# Patient Record
Sex: Female | Born: 1960 | Race: White | Hispanic: No | Marital: Married | Smoking: Never smoker
Health system: Southern US, Community
[De-identification: ages and names within clinical notes are randomized; demographics above are authoritative.]

## PROBLEM LIST (undated history)

## (undated) DIAGNOSIS — F319 Bipolar disorder, unspecified: Secondary | ICD-10-CM

## (undated) DIAGNOSIS — I10 Essential (primary) hypertension: Secondary | ICD-10-CM

## (undated) DIAGNOSIS — F329 Major depressive disorder, single episode, unspecified: Secondary | ICD-10-CM

## (undated) DIAGNOSIS — G629 Polyneuropathy, unspecified: Secondary | ICD-10-CM

## (undated) DIAGNOSIS — F4000 Agoraphobia, unspecified: Secondary | ICD-10-CM

## (undated) DIAGNOSIS — E119 Type 2 diabetes mellitus without complications: Secondary | ICD-10-CM

## (undated) DIAGNOSIS — F4310 Post-traumatic stress disorder, unspecified: Secondary | ICD-10-CM

## (undated) DIAGNOSIS — F419 Anxiety disorder, unspecified: Secondary | ICD-10-CM

## (undated) DIAGNOSIS — F32A Depression, unspecified: Secondary | ICD-10-CM

## (undated) DIAGNOSIS — F410 Panic disorder [episodic paroxysmal anxiety] without agoraphobia: Secondary | ICD-10-CM

## (undated) HISTORY — DX: Essential (primary) hypertension: I10

## (undated) HISTORY — DX: Agoraphobia, unspecified: F40.00

## (undated) HISTORY — DX: Major depressive disorder, single episode, unspecified: F32.9

## (undated) HISTORY — DX: Type 2 diabetes mellitus without complications: E11.9

## (undated) HISTORY — DX: Post-traumatic stress disorder, unspecified: F43.10

## (undated) HISTORY — DX: Depression, unspecified: F32.A

## (undated) HISTORY — PX: ACHILLES TENDON SURGERY: SHX542

## (undated) HISTORY — DX: Bipolar disorder, unspecified: F31.9

## (undated) HISTORY — DX: Anxiety disorder, unspecified: F41.9

## (undated) HISTORY — DX: Polyneuropathy, unspecified: G62.9

## (undated) HISTORY — DX: Panic disorder (episodic paroxysmal anxiety): F41.0

---

## 2000-02-11 ENCOUNTER — Other Ambulatory Visit: Admission: RE | Admit: 2000-02-11 | Discharge: 2000-02-11 | Payer: Self-pay | Admitting: Obstetrics & Gynecology

## 2000-02-14 ENCOUNTER — Emergency Department (HOSPITAL_COMMUNITY): Admission: EM | Admit: 2000-02-14 | Discharge: 2000-02-14 | Payer: Self-pay | Admitting: Emergency Medicine

## 2000-02-20 ENCOUNTER — Ambulatory Visit (HOSPITAL_COMMUNITY): Admission: RE | Admit: 2000-02-20 | Discharge: 2000-02-20 | Payer: Self-pay | Admitting: Obstetrics & Gynecology

## 2000-02-20 ENCOUNTER — Encounter: Payer: Self-pay | Admitting: Obstetrics & Gynecology

## 2000-02-22 ENCOUNTER — Emergency Department (HOSPITAL_COMMUNITY): Admission: EM | Admit: 2000-02-22 | Discharge: 2000-02-22 | Payer: Self-pay | Admitting: Emergency Medicine

## 2000-03-04 ENCOUNTER — Other Ambulatory Visit: Admission: RE | Admit: 2000-03-04 | Discharge: 2000-03-04 | Payer: Self-pay | Admitting: Obstetrics & Gynecology

## 2000-03-04 ENCOUNTER — Encounter (INDEPENDENT_AMBULATORY_CARE_PROVIDER_SITE_OTHER): Payer: Self-pay | Admitting: Specialist

## 2000-07-30 ENCOUNTER — Emergency Department (HOSPITAL_COMMUNITY): Admission: EM | Admit: 2000-07-30 | Discharge: 2000-07-30 | Payer: Self-pay | Admitting: Emergency Medicine

## 2000-07-30 ENCOUNTER — Encounter: Payer: Self-pay | Admitting: Emergency Medicine

## 2000-12-01 ENCOUNTER — Ambulatory Visit (HOSPITAL_BASED_OUTPATIENT_CLINIC_OR_DEPARTMENT_OTHER): Admission: RE | Admit: 2000-12-01 | Discharge: 2000-12-01 | Payer: Self-pay | Admitting: Orthopedic Surgery

## 2001-08-04 ENCOUNTER — Other Ambulatory Visit: Admission: RE | Admit: 2001-08-04 | Discharge: 2001-08-04 | Payer: Self-pay | Admitting: Obstetrics and Gynecology

## 2001-08-09 ENCOUNTER — Encounter: Payer: Self-pay | Admitting: Obstetrics and Gynecology

## 2001-08-09 ENCOUNTER — Encounter: Admission: RE | Admit: 2001-08-09 | Discharge: 2001-08-09 | Payer: Self-pay | Admitting: Obstetrics and Gynecology

## 2001-08-15 ENCOUNTER — Encounter: Payer: Self-pay | Admitting: Obstetrics and Gynecology

## 2001-08-15 ENCOUNTER — Ambulatory Visit (HOSPITAL_COMMUNITY): Admission: RE | Admit: 2001-08-15 | Discharge: 2001-08-15 | Payer: Self-pay | Admitting: Obstetrics and Gynecology

## 2006-06-15 ENCOUNTER — Encounter: Admission: RE | Admit: 2006-06-15 | Discharge: 2006-06-15 | Payer: Self-pay | Admitting: Sports Medicine

## 2006-09-28 ENCOUNTER — Encounter: Admission: RE | Admit: 2006-09-28 | Discharge: 2006-09-28 | Payer: Self-pay | Admitting: Neurosurgery

## 2007-09-07 ENCOUNTER — Encounter: Admission: RE | Admit: 2007-09-07 | Discharge: 2007-09-07 | Payer: Self-pay | Admitting: Internal Medicine

## 2008-03-14 ENCOUNTER — Ambulatory Visit: Payer: Self-pay | Admitting: Obstetrics and Gynecology

## 2008-03-14 ENCOUNTER — Encounter: Payer: Self-pay | Admitting: Obstetrics & Gynecology

## 2010-12-09 NOTE — Group Therapy Note (Signed)
NAMEJESSAMY, [name] NO.:  [id number]   MEDICAL RECORD NO.:  [id number]          PATIENT TYPE:  WOC   LOCATION:  [facility]                   FACILITY:  [facility]   PHYSICIAN:  Argentina Donovan, MD        DATE OF BIRTH:  Feb 04, 1961   DATE OF SERVICE:                                  CLINIC NOTE   CHIEF COMPLAINT:  Annual exam.   HISTORY OF PRESENT ILLNESS:  [name] is a 50 year old female with a  past medical history of diabetes and mood disorder who is followed by  primary care physician for her other chronic medical issues.  He also  handles her annual referrals for mammogram.  She is here today for  annual checkup including Pap smear.  Last Pap smear was in approximately  2004, although, the patient is not sure.  She says she has never had an  abnormal Pap smear.  The last mammogram was May 2009 and it was normal.   PAST MEDICAL HISTORY:  1. Hypertension.  2. Diabetes mellitus.  3. Mood disorder.   ALLERGIES:  No known drug allergies.   MEDICATIONS:  1. Lisinopril 10 mg p.o. daily.  2. Cymbalta 60 mg p.o. daily.  3. Xanax 0.25 mg p.o. p.r.n.  4. Lantus 70 units subcu at night and 60 units subcu in the morning.  5. NovoLog sliding scale with meals.   OBSTETRICAL HISTORY:  She is a G2, P2-0-0-2.  Last pregnancy was 12  years ago and both resulted in C-sections of viable infants.   PAST GYNECOLOGICAL HISTORY:  As in HPI.  She has been amenorrheic for  two years and states that the women in her family tend to go through  menopause early.   PAST SURGICAL HISTORY:  Two C-sections, first one in 78, second one in  25.  Both at [facility] in [place].  Also, two surgeries on  her feet.  She is not sure when these were.   FAMILY HISTORY:  Positive for diabetes, myocardial infarction,  hypertension, cancer including current breast cancer in her mother.  Blood clots in her aunt.   SOCIAL HISTORY:  She lives with her husband and two sons.  She  works  outside of her home at [employer].  She does not  smoke or drink alcohol or use illicit drugs.   REVIEW OF SYSTEMS:  Positive for muscle aches, fatigue, weight gain,  headaches, dizziness, stress incontinence, hot flashes and vaginal odor.   PHYSICAL EXAMINATION:  VITAL SIGNS:  Temperature 98.6, heart rate 93,  blood pressure 126/81, weight 256.8 pounds, height 63-1/4 inches.  GENERAL:  Alert, oriented, obese, pleasant and cooperative female.  HEENT:  Thyroid is normal size without tenderness or nodules.  It is  mobile.  BREAST:  Bilateral breast exam performed including the axilla.  No  lumps, no nipple discharge.  CARDIOVASCULAR:  Regular rate and rhythm with no murmurs, rubs or  gallops.  PULMONARY:  Clear to auscultation bilaterally.  Normal work of  breathing.  ABDOMEN:  Obese, soft, nontender.  Hypoactive bowel sounds.  EXTREMITIES:  Nontender without edema.  PELVIC:  Pelvic exam including a Pap smear was performed today.  Sterile  speculum exam shows a normal cervix without blood or discharge.  Normal  vaginal mucosa which is pink and well rugated, normal external genitals  without lesions.   ASSESSMENT/PLAN:  This is a 50 year old female presenting for annual Pap  smear and exam.  She is followed by primary care physician for the rest  of her medical issues.  She does to require mammogram referral as this  is handled by her primary care physician.  Pap smear was performed  today, and the patient will be informed of results.  Otherwise, an  overall normal exam.  The patient will continue to follow up with  primary care physician for diabetes, hypertension and mood disorder.     ______________________________  Dr. Romero Belling    ______________________________  Argentina Donovan, MD    /MEDQ  D:  03/14/2008  T:  03/14/2008  Job:  [id number]

## 2010-12-12 NOTE — Op Note (Signed)
[place]  Patient:    [name], [name]                      MRN: [id number] Proc. Date: 12/01/00 Adm. 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  POSTOPERATIVE DIAGNOSES: 1. 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 2. Anterior talofibular ligament (ATFL) partial tear.  PROCEDURE:  Left ankle arthroscopic removal of loose bodies from the lateral malleolar region and debridement of partial anterior talofibular ligament tear.  SURGEON:  Alinda Deem, M.D.  FIRST ASSISTANT:  Dorthula Matas, P.A.-C.  ANESTHESIA:  General endotracheal.  ESTIMATED BLOOD LOSS:  Minimal.  FLUID REPLACEMENT:  1100 cc of crystalloid.  DRAINS PLACED:  None.  TOURNIQUET TIME:  None.  ANTIBIOTICS:  The patient did receive 1 g of perioperative antibiotics, Ancef.  INDICATIONS FOR PROCEDURE:  A 50 year old insulin-dependent diabetic with increasing left ankle lateral pain consistent with either a partial ATFL tear or by x-ray, she had multiple osteochondral loose bodies in the lateral gutter of the ankle, and she was taken for arthroscopic removal of these loose bodies.  DESCRIPTION OF PROCEDURE:  Patient identified by arm band, taken to the operating room at [facility], appropriate anesthetic monitors were attached, and general endotracheal anesthesia induced with the patient in the supine position.  A tourniquet was applied to the left calf, and the left foot and ankle and distal leg prepped and draped in the usual sterile fashion from the toes to the tourniquet.  A #8 glove was then placed over the toes to further isolate the field.  Using the anterolateral approach, we then inflated the ankle joint with 10 cc of 0.5% Marcaine and epinephrine solution and made a standard anterolateral portal, allowing introduction  of the 2.7 arthroscope through the anterolateral portal for ankle arthroscopy.  The talar dome and the tibial plafond appeared to be in good condition.  The medial gutter was clear.  Moving to the lateral side, we immediately identified osteochondral loose bodies coming into the lateral gutter, and these were removed with pituitary rongeurs, arthroscopic graspers, and a 2.9 mm Barracuda sucker shaver from [employer].  We completely debrided this area all the way back to where we could just visualize one of the peroneal tendons.  One of the osteochondral loose bodies was actually adhered down to the tendon sheath. Once we were satisfied with the debridement of the lateral gutter, the ankle was washed out with normal saline solution.  We did visualize the PTFL ligament as well, and again, the ankle was washed out with normal saline solution.  The accessory portal that was inferolateral, used to remove the loose bodies, was closed with a single 4-0 nylon suture.  A dressing of Xeroform, 4 x 4 dressing sponges, Webril, and an Ace wrap applied.  The patient awakened and taken to the recovery room without difficulty. DD:  12/01/00 TD:  12/02/00 Job: [id number]

## 2012-07-23 ENCOUNTER — Ambulatory Visit (INDEPENDENT_AMBULATORY_CARE_PROVIDER_SITE_OTHER): Payer: BC Managed Care – PPO | Admitting: Emergency Medicine

## 2012-07-23 VITALS — BP 90/61 | HR 103 | Temp 98.6°F | Resp 16 | Ht 64.0 in | Wt 235.0 lb

## 2012-07-23 DIAGNOSIS — J018 Other acute sinusitis: Secondary | ICD-10-CM

## 2012-07-23 DIAGNOSIS — J209 Acute bronchitis, unspecified: Secondary | ICD-10-CM

## 2012-07-23 MED ORDER — HYDROCOD POLST-CHLORPHEN POLST 10-8 MG/5ML PO LQCR: 5.0000 mL | Freq: Two times a day (BID) | ORAL | Status: DC | PRN

## 2012-07-23 MED ORDER — PSEUDOEPHEDRINE-GUAIFENESIN ER 60-600 MG PO TB12: 1.0000 | ORAL_TABLET | Freq: Two times a day (BID) | ORAL | Status: DC

## 2012-07-23 MED ORDER — AMOXICILLIN-POT CLAVULANATE 875-125 MG PO TABS: 1.0000 | ORAL_TABLET | Freq: Two times a day (BID) | ORAL | Status: DC

## 2012-07-23 NOTE — Patient Instructions (Addendum)
Bronchitis  Bronchitis is the body's way of reacting to injury and/or infection (inflammation) of the bronchi. Bronchi are the air tubes that extend from the windpipe into the lungs. If the inflammation becomes severe, it may cause shortness of breath.  CAUSES   Inflammation may be caused by:   A virus.   Germs (bacteria).   Dust.   Allergens.   Pollutants and many other irritants.  The cells lining the bronchial tree are covered with tiny hairs (cilia). These constantly beat upward, away from the lungs, toward the mouth. This keeps the lungs free of pollutants. When these cells become too irritated and are unable to do their job, mucus begins to develop. This causes the characteristic cough of bronchitis. The cough clears the lungs when the cilia are unable to do their job. Without either of these protective mechanisms, the mucus would settle in the lungs. Then you would develop pneumonia.  Smoking is a common cause of bronchitis and can contribute to pneumonia. Stopping this habit is the single most important thing you can do to help yourself.  TREATMENT    Your caregiver may prescribe an antibiotic if the cough is caused by bacteria. Also, medicines that open up your airways make it easier to breathe. Your caregiver may also recommend or prescribe an expectorant. It will loosen the mucus to be coughed up. Only take over-the-counter or prescription medicines for pain, discomfort, or fever as directed by your caregiver.   Removing whatever causes the problem (smoking, for example) is critical to preventing the problem from getting worse.   Cough suppressants may be prescribed for relief of cough symptoms.   Inhaled medicines may be prescribed to help with symptoms now and to help prevent problems from returning.   For those with recurrent (chronic) bronchitis, there may be a need for steroid medicines.  SEEK IMMEDIATE MEDICAL CARE IF:    During treatment, you develop more pus-like mucus (purulent  sputum).   You have a fever.   Your baby is older than 3 months with a rectal temperature of 102 F (38.9 C) or higher.   Your baby is 3 months old or younger with a rectal temperature of 100.4 F (38 C) or higher.   You become progressively more ill.   You have increased difficulty breathing, wheezing, or shortness of breath.  It is necessary to seek immediate medical care if you are elderly or sick from any other disease.  MAKE SURE YOU:    Understand these instructions.   Will watch your condition.   Will get help right away if you are not doing well or get worse.  Document Released: 07/13/2005 Document Revised: 10/05/2011 Document Reviewed: 05/22/2008  ExitCare Patient Information 2013 [employer].    Sinusitis  Sinusitis is redness, soreness, and swelling (inflammation) of the paranasal sinuses. Paranasal sinuses are air pockets within the bones of your face (beneath the eyes, the middle of the forehead, or above the eyes). In healthy paranasal sinuses, mucus is able to drain out, and air is able to circulate through them by way of your nose. However, when your paranasal sinuses are inflamed, mucus and air can become trapped. This can allow bacteria and other germs to grow and cause infection.  Sinusitis can develop quickly and last only a short time (acute) or continue over a long period (chronic). Sinusitis that lasts for more than 12 weeks is considered chronic.   CAUSES   Causes of sinusitis include:   Allergies.     Structural abnormalities, such as displacement of the cartilage that separates your nostrils (deviated septum), which can decrease the air flow through your nose and sinuses and affect sinus drainage.   Functional abnormalities, such as when the small hairs (cilia) that line your sinuses and help remove mucus do not work properly or are not present.  SYMPTOMS   Symptoms of acute and chronic sinusitis are the same. The primary symptoms are pain and pressure around the affected  sinuses. Other symptoms include:   Upper toothache.   Earache.   Headache.   Bad breath.   Decreased sense of smell and taste.   A cough, which worsens when you are lying flat.   Fatigue.   Fever.   Thick drainage from your nose, which often is green and may contain pus (purulent).   Swelling and warmth over the affected sinuses.  DIAGNOSIS   Your caregiver will perform a physical exam. During the exam, your caregiver may:   Look in your nose for signs of abnormal growths in your nostrils (nasal polyps).   Tap over the affected sinus to check for signs of infection.   View the inside of your sinuses (endoscopy) with a special imaging device with a light attached (endoscope), which is inserted into your sinuses.  If your caregiver suspects that you have chronic sinusitis, one or more of the following tests may be recommended:   Allergy tests.   Nasal culture A sample of mucus is taken from your nose and sent to a lab and screened for bacteria.   Nasal cytology A sample of mucus is taken from your nose and examined by your caregiver to determine if your sinusitis is related to an allergy.  TREATMENT   Most cases of acute sinusitis are related to a viral infection and will resolve on their own within 10 days. Sometimes medicines are prescribed to help relieve symptoms (pain medicine, decongestants, nasal steroid sprays, or saline sprays).   However, for sinusitis related to a bacterial infection, your caregiver will prescribe antibiotic medicines. These are medicines that will help kill the bacteria causing the infection.   Rarely, sinusitis is caused by a fungal infection. In theses cases, your caregiver will prescribe antifungal medicine.  For some cases of chronic sinusitis, surgery is needed. Generally, these are cases in which sinusitis recurs more than 3 times per year, despite other treatments.  HOME CARE INSTRUCTIONS    Drink plenty of water. Water helps thin the mucus so your sinuses can drain  more easily.   Use a humidifier.   Inhale steam 3 to 4 times a day (for example, sit in the bathroom with the shower running).   Apply a warm, moist washcloth to your face 3 to 4 times a day, or as directed by your caregiver.   Use saline nasal sprays to help moisten and clean your sinuses.   Take over-the-counter or prescription medicines for pain, discomfort, or fever only as directed by your caregiver.  SEEK IMMEDIATE MEDICAL CARE IF:   You have increasing pain or severe headaches.   You have nausea, vomiting, or drowsiness.   You have swelling around your face.   You have vision problems.   You have a stiff neck.   You have difficulty breathing.  MAKE SURE YOU:    Understand these instructions.   Will watch your condition.   Will get help right away if you are not doing well or get worse.  Document Released: 07/13/2005 Document Revised: 10/05/2011 Document

## 2012-07-23 NOTE — Progress Notes (Signed)
[facility] [address] [place] [address] [phone]  Date:  07/23/2012   Name:  [name]   DOB:  1961/05/01   MRN:  [id number]  PCP:  No primary provider on file.    Chief Complaint: URI and Diarrhea   History of Present Illness:  [name] is a 51 y.o. very pleasant female patient who presents with the following:  Ill since Sunday with nasal congestion and drainage that has "moved to her chest" and she now has a cough productive of green sputum.  No wheezing but feel short of breath.  No nausea or vomiting. Some diarrhea yesterday.  No fever or chills.  Malaise and myalgias.  Fatigued.  No rash.  No improvement with OTC medication. Had flu shot.  Sibling and father ill at home.  There is no problem list on file for this patient.   Past Medical History  Diagnosis Date  . Anxiety   . Depression   . Diabetes mellitus without complication   . Hypertension     Past Surgical History  Procedure Date  . Cesarean section   . Achilles tendon surgery     History  Substance Use Topics  . Smoking status: Never Smoker   . Smokeless tobacco: Not on file  . Alcohol Use: No    Family History  Problem Relation Age of Onset  . Breast cancer Mother   . Hypertension Father   . Diabetes Father   . Transient ischemic attack Father     No Known Allergies  Medication list has been reviewed and updated.  Current Outpatient Prescriptions on File Prior to Visit  Medication Sig Dispense Refill  . DULoxetine (CYMBALTA) 60 MG capsule Take 60 mg by mouth daily.      . insulin aspart (NOVOLOG) 100 UNIT/ML injection Inject into the skin as needed.      . insulin glargine (LANTUS) 100 UNIT/ML injection Inject into the skin 2 (two) times daily. 70 U qam; 80 U qhs      . lisinopril (PRINIVIL,ZESTRIL) 10 MG tablet Take 10 mg by mouth daily.        Review of Systems:  As per HPI, otherwise negative.    Physical Examination: Filed Vitals:   07/23/12 0947  BP: 90/61  Pulse: 103  Temp: 98.6 F (37 C)  Resp: 16   Filed Vitals:   07/23/12 0947  Height: 5\' 4"  (1.626 m)  Weight: 235 lb (106.595 kg)   Body mass index is 40.34 kg/(m^2). Ideal Body Weight: Weight in (lb) to have BMI = 25: 145.3   GEN: WDWN, NAD, Non-toxic, A & O x 3  No rash or shortness of breath HEENT: Atraumatic, Normocephalic. Neck supple. No masses, No LAD.  Oropharynx negative Ears and Nose: No external deformity.  Green nasal drainage.  TM negative CV: RRR, No M/G/R. No JVD. No thrill. No extra heart sounds. PULM: CTA B, no wheezes, crackles, rhonchi. No retractions. No resp. distress. No accessory muscle use. ABD: S, NT, ND, +BS. No rebound. No HSM. EXTR: No c/c/e NEURO Normal gait.  PSYCH: Normally interactive. Conversant. Not depressed or anxious appearing.  Calm demeanor.    Assessment and Plan: Sinusitis Bronchitis augmentin mucinex tussionex Fluids Follow up as needed Outside window for treatment of influenza Aggressively manage sugar with infection.  Running in low 200's  Carmelina Dane, MD

## 2012-11-15 ENCOUNTER — Encounter (INDEPENDENT_AMBULATORY_CARE_PROVIDER_SITE_OTHER): Payer: BC Managed Care – PPO | Admitting: Ophthalmology

## 2012-11-15 DIAGNOSIS — E11359 Type 2 diabetes mellitus with proliferative diabetic retinopathy without macular edema: Secondary | ICD-10-CM

## 2012-11-15 DIAGNOSIS — H2510 Age-related nuclear cataract, unspecified eye: Secondary | ICD-10-CM

## 2012-11-15 DIAGNOSIS — I10 Essential (primary) hypertension: Secondary | ICD-10-CM

## 2012-11-15 DIAGNOSIS — E1165 Type 2 diabetes mellitus with hyperglycemia: Secondary | ICD-10-CM

## 2012-11-15 DIAGNOSIS — H4310 Vitreous hemorrhage, unspecified eye: Secondary | ICD-10-CM

## 2012-11-15 DIAGNOSIS — H35039 Hypertensive retinopathy, unspecified eye: Secondary | ICD-10-CM

## 2012-11-15 DIAGNOSIS — H43819 Vitreous degeneration, unspecified eye: Secondary | ICD-10-CM

## 2012-11-21 ENCOUNTER — Other Ambulatory Visit (INDEPENDENT_AMBULATORY_CARE_PROVIDER_SITE_OTHER): Payer: BC Managed Care – PPO | Admitting: Ophthalmology

## 2012-11-21 DIAGNOSIS — E1139 Type 2 diabetes mellitus with other diabetic ophthalmic complication: Secondary | ICD-10-CM

## 2012-11-21 DIAGNOSIS — E1165 Type 2 diabetes mellitus with hyperglycemia: Secondary | ICD-10-CM

## 2012-11-21 DIAGNOSIS — H3581 Retinal edema: Secondary | ICD-10-CM

## 2012-11-22 ENCOUNTER — Other Ambulatory Visit (INDEPENDENT_AMBULATORY_CARE_PROVIDER_SITE_OTHER): Payer: BC Managed Care – PPO | Admitting: Ophthalmology

## 2012-12-05 ENCOUNTER — Encounter (INDEPENDENT_AMBULATORY_CARE_PROVIDER_SITE_OTHER): Payer: BC Managed Care – PPO | Admitting: Ophthalmology

## 2012-12-05 DIAGNOSIS — E1165 Type 2 diabetes mellitus with hyperglycemia: Secondary | ICD-10-CM

## 2012-12-05 DIAGNOSIS — E1139 Type 2 diabetes mellitus with other diabetic ophthalmic complication: Secondary | ICD-10-CM

## 2012-12-05 DIAGNOSIS — E11359 Type 2 diabetes mellitus with proliferative diabetic retinopathy without macular edema: Secondary | ICD-10-CM

## 2012-12-20 ENCOUNTER — Encounter (INDEPENDENT_AMBULATORY_CARE_PROVIDER_SITE_OTHER): Payer: BC Managed Care – PPO | Admitting: Ophthalmology

## 2012-12-27 ENCOUNTER — Encounter (INDEPENDENT_AMBULATORY_CARE_PROVIDER_SITE_OTHER): Payer: BC Managed Care – PPO | Admitting: Ophthalmology

## 2012-12-27 DIAGNOSIS — E1139 Type 2 diabetes mellitus with other diabetic ophthalmic complication: Secondary | ICD-10-CM

## 2012-12-27 DIAGNOSIS — H3581 Retinal edema: Secondary | ICD-10-CM

## 2012-12-27 DIAGNOSIS — E11359 Type 2 diabetes mellitus with proliferative diabetic retinopathy without macular edema: Secondary | ICD-10-CM

## 2013-02-17 ENCOUNTER — Other Ambulatory Visit: Payer: Self-pay | Admitting: Orthopedic Surgery

## 2013-02-17 DIAGNOSIS — M25561 Pain in right knee: Secondary | ICD-10-CM

## 2013-02-17 DIAGNOSIS — R531 Weakness: Secondary | ICD-10-CM

## 2013-02-17 DIAGNOSIS — M25461 Effusion, right knee: Secondary | ICD-10-CM

## 2013-03-18 ENCOUNTER — Ambulatory Visit
Admission: RE | Admit: 2013-03-18 | Discharge: 2013-03-18 | Disposition: A | Discharge status: Home / Self Care | Payer: BC Managed Care – PPO | Source: Ambulatory Visit | Attending: Orthopedic Surgery | Admitting: Orthopedic Surgery

## 2013-03-18 DIAGNOSIS — M25461 Effusion, right knee: Secondary | ICD-10-CM

## 2013-03-18 DIAGNOSIS — R531 Weakness: Secondary | ICD-10-CM

## 2013-03-18 DIAGNOSIS — M25561 Pain in right knee: Secondary | ICD-10-CM

## 2013-05-02 ENCOUNTER — Ambulatory Visit (INDEPENDENT_AMBULATORY_CARE_PROVIDER_SITE_OTHER): Payer: BC Managed Care – PPO | Admitting: Ophthalmology

## 2013-05-02 DIAGNOSIS — E11359 Type 2 diabetes mellitus with proliferative diabetic retinopathy without macular edema: Secondary | ICD-10-CM

## 2013-05-02 DIAGNOSIS — H2510 Age-related nuclear cataract, unspecified eye: Secondary | ICD-10-CM

## 2013-05-02 DIAGNOSIS — H35039 Hypertensive retinopathy, unspecified eye: Secondary | ICD-10-CM

## 2013-05-02 DIAGNOSIS — H43819 Vitreous degeneration, unspecified eye: Secondary | ICD-10-CM

## 2013-05-02 DIAGNOSIS — E1139 Type 2 diabetes mellitus with other diabetic ophthalmic complication: Secondary | ICD-10-CM

## 2013-05-02 DIAGNOSIS — I10 Essential (primary) hypertension: Secondary | ICD-10-CM

## 2013-05-02 DIAGNOSIS — H4310 Vitreous hemorrhage, unspecified eye: Secondary | ICD-10-CM

## 2013-06-13 ENCOUNTER — Ambulatory Visit (INDEPENDENT_AMBULATORY_CARE_PROVIDER_SITE_OTHER): Payer: BC Managed Care – PPO | Admitting: Physician Assistant

## 2013-06-13 VITALS — BP 144/80 | HR 87 | Temp 98.7°F | Resp 18 | Ht 64.0 in | Wt 251.0 lb

## 2013-06-13 DIAGNOSIS — H6121 Impacted cerumen, right ear: Secondary | ICD-10-CM

## 2013-06-13 DIAGNOSIS — H60399 Other infective otitis externa, unspecified ear: Secondary | ICD-10-CM

## 2013-06-13 DIAGNOSIS — H7390 Unspecified disorder of tympanic membrane, unspecified ear: Secondary | ICD-10-CM

## 2013-06-13 DIAGNOSIS — H9191 Unspecified hearing loss, right ear: Secondary | ICD-10-CM

## 2013-06-13 DIAGNOSIS — H60391 Other infective otitis externa, right ear: Secondary | ICD-10-CM

## 2013-06-13 MED ORDER — FLUCONAZOLE 100 MG PO TABS: 100.0000 mg | ORAL_TABLET | Freq: Every day | ORAL | Status: DC

## 2013-06-13 MED ORDER — CIPROFLOXACIN-DEXAMETHASONE 0.3-0.1 % OT SUSP: 4.0000 [drp] | Freq: Two times a day (BID) | OTIC | Status: DC

## 2013-06-13 MED ORDER — AMOXICILLIN 875 MG PO TABS: 875.0000 mg | ORAL_TABLET | Freq: Two times a day (BID) | ORAL | Status: DC

## 2013-06-13 NOTE — Progress Notes (Signed)
Patient ID: [name] MRN: [id number], DOB: 1961-04-02, 52 y.o. Date of Encounter: 06/13/2013, 4:07 PM  Primary Physician: No primary provider on file.  Chief Complaint: Right ear clogged x 2 weeks  HPI: 52 y.o. female with history below presents with complaint of her right ear being clogged for the past 2 weeks. Sounds are sometimes muffled and sometimes exaggerated. Noted a clear discharge today while at the doctor for her husband. Sometimes feels like there is something in her ear canal. She has been putting Q tips in her ear canal. Has been trying swimmers ear drops without success. No prior episodes of anything similar. No recent illnesses. She is otherwise feeling ok without issues or complaints.    Past Medical History  Diagnosis Date  . Anxiety   . Depression   . Diabetes mellitus without complication   . Hypertension      Home Meds: Prior to Admission medications   Medication Sig Start Date End Date Taking? Authorizing Provider  ALPRAZolam (XANAX) 0.25 MG tablet Take 0.25 mg by mouth as needed.   Yes Historical Provider, MD  DULoxetine (CYMBALTA) 60 MG capsule Take 60 mg by mouth daily.   Yes Historical Provider, MD  insulin aspart (NOVOLOG) 100 UNIT/ML injection Inject into the skin as needed.   Yes Historical Provider, MD  insulin glargine (LANTUS) 100 UNIT/ML injection Inject into the skin 2 (two) times daily. 70 U qam; 80 U qhs   Yes Historical Provider, MD  lisinopril (PRINIVIL,ZESTRIL) 10 MG tablet Take 10 mg by mouth daily.   Yes Historical Provider, MD    Allergies: No Known Allergies  History   Social History  . Marital Status: Married    Spouse Name: N/A    Number of Children: N/A  . Years of Education: N/A   Occupational History  . Not on file.   Social History Main Topics  . Smoking status: Never Smoker   . Smokeless tobacco: Not on file  . Alcohol Use: No  . Drug Use: No  . Sexual Activity: Not on file   Other Topics Concern  . Not on  file   Social History Narrative  . No narrative on file     Review of Systems: Constitutional: negative for chills, fever, or fatigue  HEENT: positive for hearing loss. negative for vision changes, congestion, rhinorrhea, ST, or sinus pressure Cardiovascular: negative for chest pain or palpitations Respiratory: negative for wheezing, shortness of breath, or cough Dermatological: negative for rash Neurologic: negative for headache   Physical Exam: Blood pressure 144/80, pulse 87, temperature 98.7 F (37.1 C), temperature source Oral, resp. rate 18, height 5\' 4"  (1.626 m), weight 251 lb (113.853 kg), SpO2 96.00%., Body mass index is 43.06 kg/(m^2). General: Well developed, well nourished, in no acute distress. Head: Normocephalic, atraumatic, eyes without discharge, sclera non-icteric, nares are without discharge. Right auditory canal with cerumen impaction. Left auditory canal clear, status post gentle ear lavage by Eileen Stanford. Right TM with purulence behind and without perforation. Left TM without perforation, pearly grey and translucent with reflective cone of light. Hearing is improved status post ear lavage. Oral cavity moist, posterior pharynx without exudate, erythema, peritonsillar abscess, or post nasal drip. Neck: Supple. No thyromegaly. Full ROM. No lymphadenopathy. Lungs: Clear bilaterally to auscultation without wheezes, rales, or rhonchi. Breathing is unlabored. Heart: RRR with S1 S2. No murmurs, rubs, or gallops appreciated. Msk:  Strength and tone normal for age. Extremities/Skin: Warm and dry. No clubbing or cyanosis. No edema. No  rashes or suspicious lesions. Neuro: Alert and oriented X 3. Moves all extremities spontaneously. Gait is normal. CNII-XII grossly in tact. Psych:  Responds to questions appropriately with a normal affect.     ASSESSMENT AND PLAN:  52 y.o. female with right sided otitis externa status post removal of purulent debris, muffled hearing, and history of  noncontrolled IDDM -Last A1C 8 two months ago -Blood sugars now running in the 140's  -Ear lavage per above -Ciprodex 4 gtt into right ear bid #1 no RF -Amoxicillin 875 mg 1 po bid #20 no RF -Diflucan 100 mg 1 po daily #5 no RF -Colace prn -Avoid Q tip usage -RTC prn  Signed, Eula Listen, PA-C [facility] [place], [place] [address] [phone] 06/13/2013 4:07 PM

## 2013-10-03 ENCOUNTER — Ambulatory Visit (INDEPENDENT_AMBULATORY_CARE_PROVIDER_SITE_OTHER): Payer: BC Managed Care – PPO | Admitting: Internal Medicine

## 2013-10-03 ENCOUNTER — Ambulatory Visit: Payer: BC Managed Care – PPO

## 2013-10-03 ENCOUNTER — Other Ambulatory Visit: Payer: Self-pay | Admitting: Internal Medicine

## 2013-10-03 VITALS — BP 146/80 | HR 86 | Temp 98.9°F | Resp 18

## 2013-10-03 DIAGNOSIS — M7710 Lateral epicondylitis, unspecified elbow: Secondary | ICD-10-CM

## 2013-10-03 DIAGNOSIS — M25579 Pain in unspecified ankle and joints of unspecified foot: Secondary | ICD-10-CM

## 2013-10-03 DIAGNOSIS — E119 Type 2 diabetes mellitus without complications: Secondary | ICD-10-CM

## 2013-10-03 DIAGNOSIS — M25539 Pain in unspecified wrist: Secondary | ICD-10-CM

## 2013-10-03 DIAGNOSIS — M25519 Pain in unspecified shoulder: Secondary | ICD-10-CM

## 2013-10-03 NOTE — Progress Notes (Signed)
Subjective:   This chart was scribed for Ellamae Siaobert Denetta Fei, MD, by Yevette EdwardsAngela Bracken, scribe. The pt's care was started at 4:20 PM.    Patient ID: [name], female    DOB: 11/07/1960, 53 y.o.   MRN: [id number]  HPI  HPI Comments: [name] is a 53 y.o. female who [facility] complaining of left ankle pain which occurred when she stepped on a rock and rolled her ankle an hour ago. She has a h/o left ankle surgery.   She also states pain to her right shoulder. She reports baseline right shoulder,elbow, and hand pain. The fall exacerbated the shoulder pain. She states the baseline pain is increased with grasping objects and with palpation. She reports the shoulder pain often prevents her from sleeping well.   Patient Active Problem List   Diagnosis Date Noted  . Type II or unspecified type diabetes mellitus without mention of complication, not stated as uncontrolled 10/04/2013  . Severe obesity (BMI >= 40) 10/04/2013   Current outpatient prescriptions:ALPRAZolam (XANAX) 0.25 MG tablet, Take 0.25 mg by mouth as needed., Disp: , Rfl: ;  DULoxetine (CYMBALTA) 60 MG capsule, Take 60 mg by mouth daily., Disp: , Rfl: ;  insulin aspart (NOVOLOG) 100 UNIT/ML injection, Inject into the skin as needed., Disp: , Rfl: ;  insulin glargine (LANTUS) 100 UNIT/ML injection, Inject into the skin 2 (two) times daily. 70 U qam; 80 U qhs, Disp: , Rfl:  lisinopril (PRINIVIL,ZESTRIL) 10 MG tablet, Take 10 mg by mouth daily., Disp: , Rfl:   Review of Systems  Musculoskeletal: Positive for arthralgias and myalgias.   cardiovascular negative Pulmonary negative GI and GU negative     Objective:   Physical Exam  Nursing note and vitals reviewed. Constitutional: She is oriented to person, place, and time. She appears well-developed and well-nourished. No distress.  HENT:  Head: Normocephalic and atraumatic.  Eyes: EOM are normal.  Neck: Neck supple. No tracheal deviation present.  Cardiovascular:  Normal rate.   Pulmonary/Chest: Effort normal. No respiratory distress.  Musculoskeletal: Normal range of motion. She exhibits tenderness.  Left ankle is swollen laterally. Tender to palpation from distal fibula into the calcaneous. Pain with any ROM of the ankle. Unable to bear weight.   Wrist has full ROM without pain. Tender around right lateral epicondyle with pain upon ROM. There is pain of the right shoulder with Abduction with resistance.   Neurological: She is alert and oriented to person, place, and time.  Skin: Skin is warm and dry.  Psychiatric: She has a normal mood and affect. Her behavior is normal.   Vitals: BP 146/80  Pulse 86  Temp(Src) 98.9 F (37.2 C) (Oral)  Resp 18  SpO2 98%  4:49 PM- Left ankle x-ray: degenerative changes, but no acute fracture. From Ellamae Siaobert Kyros Salzwedel, MD.      Assessment & Plan:   4:21 PM- Discussed treatment plan with patient, which includes imaging, and the patient agreed to the plan. Also instructed pt in several exercises to mitigate the tendonitis to her elbow.  Advised the pt she may benefit from PT for the shoulder tendonitis.   4:51 PM- Rechecked pt. Informed her of imaging. Encouraged pt to utilize ice, elevation, and a boot to treat the ankle pain.   Pain in joint, ankle and foot secondary to moderate spurring- Plan: Ambulatory referral to Orthopedic Surgery, Cam  Walker  Shoulder pain - Plan: Ambulatory referral to Orthopedic Surgery  Wrist pain - Plan: Ambulatory referral to  Orthopedic Surgery  Lateral epicondylitis - Plan: Ambulatory referral to Orthopedic Surgery  Type II or unspecified type diabetes mellitus without mention of complication, not stated as uncontrolled  Severe obesity (BMI >= 40)  otc meds///Cam Walker She would prefer to followup with her regular orthopedist for care at this point (doctors Eulah Pont and [place])   Addendum-radiology interpretation suggests a non-displaced fracture of the fifth metatarsal This  has a space that seems wide and might be chronic since she was not very tender on pressure at that point Cam Dan Humphreys will be appropriate for this as well with orthopedic followup

## 2013-10-04 DIAGNOSIS — E119 Type 2 diabetes mellitus without complications: Secondary | ICD-10-CM | POA: Insufficient documentation

## 2013-11-07 ENCOUNTER — Ambulatory Visit (INDEPENDENT_AMBULATORY_CARE_PROVIDER_SITE_OTHER): Payer: BC Managed Care – PPO | Admitting: Ophthalmology

## 2013-11-07 DIAGNOSIS — I10 Essential (primary) hypertension: Secondary | ICD-10-CM

## 2013-11-07 DIAGNOSIS — H35039 Hypertensive retinopathy, unspecified eye: Secondary | ICD-10-CM

## 2013-11-07 DIAGNOSIS — E1165 Type 2 diabetes mellitus with hyperglycemia: Secondary | ICD-10-CM

## 2013-11-07 DIAGNOSIS — E1139 Type 2 diabetes mellitus with other diabetic ophthalmic complication: Secondary | ICD-10-CM

## 2013-11-07 DIAGNOSIS — E11359 Type 2 diabetes mellitus with proliferative diabetic retinopathy without macular edema: Secondary | ICD-10-CM

## 2013-11-07 DIAGNOSIS — H2510 Age-related nuclear cataract, unspecified eye: Secondary | ICD-10-CM

## 2013-11-07 DIAGNOSIS — H43819 Vitreous degeneration, unspecified eye: Secondary | ICD-10-CM

## 2013-11-15 ENCOUNTER — Ambulatory Visit (INDEPENDENT_AMBULATORY_CARE_PROVIDER_SITE_OTHER): Payer: BC Managed Care – PPO | Admitting: Ophthalmology

## 2013-11-15 DIAGNOSIS — E1165 Type 2 diabetes mellitus with hyperglycemia: Secondary | ICD-10-CM

## 2013-11-15 DIAGNOSIS — E1139 Type 2 diabetes mellitus with other diabetic ophthalmic complication: Secondary | ICD-10-CM

## 2013-11-15 DIAGNOSIS — E11359 Type 2 diabetes mellitus with proliferative diabetic retinopathy without macular edema: Secondary | ICD-10-CM

## 2014-03-19 ENCOUNTER — Ambulatory Visit (INDEPENDENT_AMBULATORY_CARE_PROVIDER_SITE_OTHER): Payer: BC Managed Care – PPO | Admitting: Ophthalmology

## 2014-03-19 DIAGNOSIS — E1165 Type 2 diabetes mellitus with hyperglycemia: Secondary | ICD-10-CM

## 2014-03-19 DIAGNOSIS — H2510 Age-related nuclear cataract, unspecified eye: Secondary | ICD-10-CM

## 2014-03-19 DIAGNOSIS — H35039 Hypertensive retinopathy, unspecified eye: Secondary | ICD-10-CM

## 2014-03-19 DIAGNOSIS — H43819 Vitreous degeneration, unspecified eye: Secondary | ICD-10-CM

## 2014-03-19 DIAGNOSIS — I10 Essential (primary) hypertension: Secondary | ICD-10-CM

## 2014-03-19 DIAGNOSIS — E1139 Type 2 diabetes mellitus with other diabetic ophthalmic complication: Secondary | ICD-10-CM

## 2014-03-19 DIAGNOSIS — E11359 Type 2 diabetes mellitus with proliferative diabetic retinopathy without macular edema: Secondary | ICD-10-CM

## 2014-09-06 ENCOUNTER — Other Ambulatory Visit: Payer: Self-pay | Admitting: Internal Medicine

## 2014-09-06 DIAGNOSIS — Z1231 Encounter for screening mammogram for malignant neoplasm of breast: Secondary | ICD-10-CM

## 2014-09-19 ENCOUNTER — Ambulatory Visit (INDEPENDENT_AMBULATORY_CARE_PROVIDER_SITE_OTHER): Payer: BC Managed Care – PPO | Admitting: Ophthalmology

## 2014-09-24 ENCOUNTER — Ambulatory Visit (INDEPENDENT_AMBULATORY_CARE_PROVIDER_SITE_OTHER): Payer: BLUE CROSS/BLUE SHIELD | Admitting: Ophthalmology

## 2014-09-24 DIAGNOSIS — H43812 Vitreous degeneration, left eye: Secondary | ICD-10-CM

## 2014-09-24 DIAGNOSIS — I10 Essential (primary) hypertension: Secondary | ICD-10-CM

## 2014-09-24 DIAGNOSIS — E11351 Type 2 diabetes mellitus with proliferative diabetic retinopathy with macular edema: Secondary | ICD-10-CM

## 2014-09-24 DIAGNOSIS — E11311 Type 2 diabetes mellitus with unspecified diabetic retinopathy with macular edema: Secondary | ICD-10-CM | POA: Diagnosis not present

## 2014-09-24 DIAGNOSIS — H35033 Hypertensive retinopathy, bilateral: Secondary | ICD-10-CM | POA: Diagnosis not present

## 2014-09-24 DIAGNOSIS — H2513 Age-related nuclear cataract, bilateral: Secondary | ICD-10-CM | POA: Diagnosis not present

## 2014-10-01 ENCOUNTER — Encounter (INDEPENDENT_AMBULATORY_CARE_PROVIDER_SITE_OTHER): Payer: BLUE CROSS/BLUE SHIELD | Admitting: Ophthalmology

## 2014-10-01 DIAGNOSIS — E11351 Type 2 diabetes mellitus with proliferative diabetic retinopathy with macular edema: Secondary | ICD-10-CM | POA: Diagnosis not present

## 2014-10-01 DIAGNOSIS — E11311 Type 2 diabetes mellitus with unspecified diabetic retinopathy with macular edema: Secondary | ICD-10-CM

## 2014-10-02 ENCOUNTER — Ambulatory Visit: Payer: Self-pay

## 2015-01-31 ENCOUNTER — Ambulatory Visit (INDEPENDENT_AMBULATORY_CARE_PROVIDER_SITE_OTHER): Payer: BLUE CROSS/BLUE SHIELD | Admitting: Ophthalmology

## 2015-03-11 ENCOUNTER — Ambulatory Visit (INDEPENDENT_AMBULATORY_CARE_PROVIDER_SITE_OTHER): Payer: BLUE CROSS/BLUE SHIELD | Admitting: Ophthalmology

## 2015-03-25 ENCOUNTER — Ambulatory Visit (INDEPENDENT_AMBULATORY_CARE_PROVIDER_SITE_OTHER): Payer: BLUE CROSS/BLUE SHIELD | Admitting: Ophthalmology

## 2015-04-24 ENCOUNTER — Ambulatory Visit (INDEPENDENT_AMBULATORY_CARE_PROVIDER_SITE_OTHER): Payer: BLUE CROSS/BLUE SHIELD | Admitting: Ophthalmology

## 2015-05-02 ENCOUNTER — Ambulatory Visit (INDEPENDENT_AMBULATORY_CARE_PROVIDER_SITE_OTHER): Payer: BLUE CROSS/BLUE SHIELD | Admitting: Ophthalmology

## 2015-05-02 DIAGNOSIS — E11311 Type 2 diabetes mellitus with unspecified diabetic retinopathy with macular edema: Secondary | ICD-10-CM

## 2015-05-02 DIAGNOSIS — I10 Essential (primary) hypertension: Secondary | ICD-10-CM

## 2015-05-02 DIAGNOSIS — E113591 Type 2 diabetes mellitus with proliferative diabetic retinopathy without macular edema, right eye: Secondary | ICD-10-CM | POA: Diagnosis not present

## 2015-05-02 DIAGNOSIS — E113512 Type 2 diabetes mellitus with proliferative diabetic retinopathy with macular edema, left eye: Secondary | ICD-10-CM

## 2015-05-02 DIAGNOSIS — H35033 Hypertensive retinopathy, bilateral: Secondary | ICD-10-CM

## 2015-05-02 DIAGNOSIS — H43813 Vitreous degeneration, bilateral: Secondary | ICD-10-CM | POA: Diagnosis not present

## 2015-10-31 ENCOUNTER — Ambulatory Visit (INDEPENDENT_AMBULATORY_CARE_PROVIDER_SITE_OTHER): Payer: BLUE CROSS/BLUE SHIELD | Admitting: Ophthalmology

## 2016-02-19 DIAGNOSIS — E1139 Type 2 diabetes mellitus with other diabetic ophthalmic complication: Secondary | ICD-10-CM | POA: Diagnosis not present

## 2016-05-18 DIAGNOSIS — Z6841 Body Mass Index (BMI) 40.0 and over, adult: Secondary | ICD-10-CM | POA: Diagnosis not present

## 2016-05-18 DIAGNOSIS — J09X2 Influenza due to identified novel influenza A virus with other respiratory manifestations: Secondary | ICD-10-CM | POA: Diagnosis not present

## 2016-05-18 DIAGNOSIS — R05 Cough: Secondary | ICD-10-CM | POA: Diagnosis not present

## 2016-05-18 DIAGNOSIS — R509 Fever, unspecified: Secondary | ICD-10-CM | POA: Diagnosis not present

## 2016-07-28 DIAGNOSIS — E113593 Type 2 diabetes mellitus with proliferative diabetic retinopathy without macular edema, bilateral: Secondary | ICD-10-CM | POA: Diagnosis not present

## 2016-07-28 DIAGNOSIS — H4312 Vitreous hemorrhage, left eye: Secondary | ICD-10-CM | POA: Diagnosis not present

## 2016-09-01 DIAGNOSIS — H25813 Combined forms of age-related cataract, bilateral: Secondary | ICD-10-CM | POA: Diagnosis not present

## 2016-09-01 DIAGNOSIS — H2512 Age-related nuclear cataract, left eye: Secondary | ICD-10-CM | POA: Diagnosis not present

## 2016-09-01 DIAGNOSIS — H524 Presbyopia: Secondary | ICD-10-CM | POA: Diagnosis not present

## 2016-10-07 DIAGNOSIS — H25812 Combined forms of age-related cataract, left eye: Secondary | ICD-10-CM | POA: Diagnosis not present

## 2016-10-07 DIAGNOSIS — H2512 Age-related nuclear cataract, left eye: Secondary | ICD-10-CM | POA: Diagnosis not present

## 2016-10-08 DIAGNOSIS — N183 Chronic kidney disease, stage 3 (moderate): Secondary | ICD-10-CM | POA: Diagnosis not present

## 2016-10-08 DIAGNOSIS — E1139 Type 2 diabetes mellitus with other diabetic ophthalmic complication: Secondary | ICD-10-CM | POA: Diagnosis not present

## 2016-10-08 DIAGNOSIS — D638 Anemia in other chronic diseases classified elsewhere: Secondary | ICD-10-CM | POA: Diagnosis not present

## 2016-10-08 DIAGNOSIS — F1320 Sedative, hypnotic or anxiolytic dependence, uncomplicated: Secondary | ICD-10-CM | POA: Diagnosis not present

## 2016-10-14 DIAGNOSIS — H2511 Age-related nuclear cataract, right eye: Secondary | ICD-10-CM | POA: Diagnosis not present

## 2016-10-26 DIAGNOSIS — H2511 Age-related nuclear cataract, right eye: Secondary | ICD-10-CM | POA: Diagnosis not present

## 2016-10-26 DIAGNOSIS — H25811 Combined forms of age-related cataract, right eye: Secondary | ICD-10-CM | POA: Diagnosis not present

## 2016-11-04 DIAGNOSIS — H4312 Vitreous hemorrhage, left eye: Secondary | ICD-10-CM | POA: Diagnosis not present

## 2016-11-04 DIAGNOSIS — H35372 Puckering of macula, left eye: Secondary | ICD-10-CM | POA: Diagnosis not present

## 2016-11-04 DIAGNOSIS — E113512 Type 2 diabetes mellitus with proliferative diabetic retinopathy with macular edema, left eye: Secondary | ICD-10-CM | POA: Diagnosis not present

## 2016-11-04 DIAGNOSIS — E113591 Type 2 diabetes mellitus with proliferative diabetic retinopathy without macular edema, right eye: Secondary | ICD-10-CM | POA: Diagnosis not present

## 2016-11-19 DIAGNOSIS — E113512 Type 2 diabetes mellitus with proliferative diabetic retinopathy with macular edema, left eye: Secondary | ICD-10-CM | POA: Diagnosis not present

## 2016-11-19 DIAGNOSIS — H35372 Puckering of macula, left eye: Secondary | ICD-10-CM | POA: Diagnosis not present

## 2016-11-19 DIAGNOSIS — H4312 Vitreous hemorrhage, left eye: Secondary | ICD-10-CM | POA: Diagnosis not present

## 2016-11-27 DIAGNOSIS — H4312 Vitreous hemorrhage, left eye: Secondary | ICD-10-CM | POA: Diagnosis not present

## 2016-11-27 DIAGNOSIS — H35372 Puckering of macula, left eye: Secondary | ICD-10-CM | POA: Diagnosis not present

## 2016-11-27 DIAGNOSIS — E113512 Type 2 diabetes mellitus with proliferative diabetic retinopathy with macular edema, left eye: Secondary | ICD-10-CM | POA: Diagnosis not present

## 2016-12-03 DIAGNOSIS — I10 Essential (primary) hypertension: Secondary | ICD-10-CM | POA: Diagnosis not present

## 2016-12-03 DIAGNOSIS — E784 Other hyperlipidemia: Secondary | ICD-10-CM | POA: Diagnosis not present

## 2016-12-03 DIAGNOSIS — E1139 Type 2 diabetes mellitus with other diabetic ophthalmic complication: Secondary | ICD-10-CM | POA: Diagnosis not present

## 2016-12-03 DIAGNOSIS — Z0000 Encounter for general adult medical examination without abnormal findings: Secondary | ICD-10-CM | POA: Diagnosis not present

## 2016-12-10 DIAGNOSIS — Z0000 Encounter for general adult medical examination without abnormal findings: Secondary | ICD-10-CM | POA: Diagnosis not present

## 2016-12-10 DIAGNOSIS — E1139 Type 2 diabetes mellitus with other diabetic ophthalmic complication: Secondary | ICD-10-CM | POA: Diagnosis not present

## 2016-12-10 DIAGNOSIS — Z794 Long term (current) use of insulin: Secondary | ICD-10-CM | POA: Diagnosis not present

## 2016-12-10 DIAGNOSIS — N183 Chronic kidney disease, stage 3 (moderate): Secondary | ICD-10-CM | POA: Diagnosis not present

## 2016-12-10 DIAGNOSIS — Z6841 Body Mass Index (BMI) 40.0 and over, adult: Secondary | ICD-10-CM | POA: Diagnosis not present

## 2016-12-10 DIAGNOSIS — D638 Anemia in other chronic diseases classified elsewhere: Secondary | ICD-10-CM | POA: Diagnosis not present

## 2016-12-18 DIAGNOSIS — E113512 Type 2 diabetes mellitus with proliferative diabetic retinopathy with macular edema, left eye: Secondary | ICD-10-CM | POA: Diagnosis not present

## 2017-08-27 DIAGNOSIS — E113513 Type 2 diabetes mellitus with proliferative diabetic retinopathy with macular edema, bilateral: Secondary | ICD-10-CM | POA: Diagnosis not present

## 2017-08-27 DIAGNOSIS — H43811 Vitreous degeneration, right eye: Secondary | ICD-10-CM | POA: Diagnosis not present

## 2017-09-13 DIAGNOSIS — R82998 Other abnormal findings in urine: Secondary | ICD-10-CM | POA: Diagnosis not present

## 2017-09-13 DIAGNOSIS — E1139 Type 2 diabetes mellitus with other diabetic ophthalmic complication: Secondary | ICD-10-CM | POA: Diagnosis not present

## 2017-09-13 DIAGNOSIS — Z0000 Encounter for general adult medical examination without abnormal findings: Secondary | ICD-10-CM | POA: Diagnosis not present

## 2017-09-13 DIAGNOSIS — E7849 Other hyperlipidemia: Secondary | ICD-10-CM | POA: Diagnosis not present

## 2017-09-15 DIAGNOSIS — Z23 Encounter for immunization: Secondary | ICD-10-CM | POA: Diagnosis not present

## 2017-09-15 DIAGNOSIS — Z1331 Encounter for screening for depression: Secondary | ICD-10-CM | POA: Diagnosis not present

## 2017-09-15 DIAGNOSIS — D638 Anemia in other chronic diseases classified elsewhere: Secondary | ICD-10-CM | POA: Diagnosis not present

## 2017-09-15 DIAGNOSIS — E1140 Type 2 diabetes mellitus with diabetic neuropathy, unspecified: Secondary | ICD-10-CM | POA: Diagnosis not present

## 2017-09-15 DIAGNOSIS — F1320 Sedative, hypnotic or anxiolytic dependence, uncomplicated: Secondary | ICD-10-CM | POA: Diagnosis not present

## 2017-09-15 DIAGNOSIS — N183 Chronic kidney disease, stage 3 (moderate): Secondary | ICD-10-CM | POA: Diagnosis not present

## 2017-09-16 DIAGNOSIS — Z1212 Encounter for screening for malignant neoplasm of rectum: Secondary | ICD-10-CM | POA: Diagnosis not present

## 2017-09-30 DIAGNOSIS — L918 Other hypertrophic disorders of the skin: Secondary | ICD-10-CM | POA: Diagnosis not present

## 2017-09-30 DIAGNOSIS — Z01419 Encounter for gynecological examination (general) (routine) without abnormal findings: Secondary | ICD-10-CM | POA: Diagnosis not present

## 2017-11-09 DIAGNOSIS — Z1151 Encounter for screening for human papillomavirus (HPV): Secondary | ICD-10-CM | POA: Diagnosis not present

## 2017-11-09 DIAGNOSIS — Z01419 Encounter for gynecological examination (general) (routine) without abnormal findings: Secondary | ICD-10-CM | POA: Diagnosis not present

## 2017-11-09 DIAGNOSIS — E1139 Type 2 diabetes mellitus with other diabetic ophthalmic complication: Secondary | ICD-10-CM | POA: Diagnosis not present

## 2017-11-09 DIAGNOSIS — F329 Major depressive disorder, single episode, unspecified: Secondary | ICD-10-CM | POA: Diagnosis not present

## 2017-11-24 DIAGNOSIS — F329 Major depressive disorder, single episode, unspecified: Secondary | ICD-10-CM | POA: Diagnosis not present

## 2017-11-24 DIAGNOSIS — F419 Anxiety disorder, unspecified: Secondary | ICD-10-CM | POA: Diagnosis not present

## 2017-12-07 ENCOUNTER — Encounter: Payer: Self-pay | Admitting: Internal Medicine

## 2017-12-09 DIAGNOSIS — F419 Anxiety disorder, unspecified: Secondary | ICD-10-CM | POA: Diagnosis not present

## 2017-12-09 DIAGNOSIS — F329 Major depressive disorder, single episode, unspecified: Secondary | ICD-10-CM | POA: Diagnosis not present

## 2018-01-07 DIAGNOSIS — F329 Major depressive disorder, single episode, unspecified: Secondary | ICD-10-CM | POA: Diagnosis not present

## 2018-01-07 DIAGNOSIS — F419 Anxiety disorder, unspecified: Secondary | ICD-10-CM | POA: Diagnosis not present

## 2018-01-26 DIAGNOSIS — E1139 Type 2 diabetes mellitus with other diabetic ophthalmic complication: Secondary | ICD-10-CM | POA: Diagnosis not present

## 2018-01-26 DIAGNOSIS — E1140 Type 2 diabetes mellitus with diabetic neuropathy, unspecified: Secondary | ICD-10-CM | POA: Diagnosis not present

## 2018-01-26 DIAGNOSIS — G6289 Other specified polyneuropathies: Secondary | ICD-10-CM | POA: Diagnosis not present

## 2018-01-26 DIAGNOSIS — E1165 Type 2 diabetes mellitus with hyperglycemia: Secondary | ICD-10-CM | POA: Diagnosis not present

## 2018-06-01 DIAGNOSIS — I10 Essential (primary) hypertension: Secondary | ICD-10-CM | POA: Diagnosis not present

## 2018-06-01 DIAGNOSIS — E11319 Type 2 diabetes mellitus with unspecified diabetic retinopathy without macular edema: Secondary | ICD-10-CM | POA: Diagnosis not present

## 2018-06-01 DIAGNOSIS — E1140 Type 2 diabetes mellitus with diabetic neuropathy, unspecified: Secondary | ICD-10-CM | POA: Diagnosis not present

## 2018-06-01 DIAGNOSIS — E1139 Type 2 diabetes mellitus with other diabetic ophthalmic complication: Secondary | ICD-10-CM | POA: Diagnosis not present

## 2018-06-01 DIAGNOSIS — E1165 Type 2 diabetes mellitus with hyperglycemia: Secondary | ICD-10-CM | POA: Diagnosis not present

## 2018-08-25 DIAGNOSIS — F1320 Sedative, hypnotic or anxiolytic dependence, uncomplicated: Secondary | ICD-10-CM | POA: Diagnosis not present

## 2018-08-25 DIAGNOSIS — F329 Major depressive disorder, single episode, unspecified: Secondary | ICD-10-CM | POA: Diagnosis not present

## 2018-08-25 DIAGNOSIS — I10 Essential (primary) hypertension: Secondary | ICD-10-CM | POA: Diagnosis not present

## 2018-08-25 DIAGNOSIS — F419 Anxiety disorder, unspecified: Secondary | ICD-10-CM | POA: Diagnosis not present

## 2018-10-03 DIAGNOSIS — E1165 Type 2 diabetes mellitus with hyperglycemia: Secondary | ICD-10-CM | POA: Diagnosis not present

## 2018-10-03 DIAGNOSIS — R82998 Other abnormal findings in urine: Secondary | ICD-10-CM | POA: Diagnosis not present

## 2018-10-03 DIAGNOSIS — E7849 Other hyperlipidemia: Secondary | ICD-10-CM | POA: Diagnosis not present

## 2018-10-03 DIAGNOSIS — I129 Hypertensive chronic kidney disease with stage 1 through stage 4 chronic kidney disease, or unspecified chronic kidney disease: Secondary | ICD-10-CM | POA: Diagnosis not present

## 2018-10-10 DIAGNOSIS — E1139 Type 2 diabetes mellitus with other diabetic ophthalmic complication: Secondary | ICD-10-CM | POA: Diagnosis not present

## 2018-10-10 DIAGNOSIS — Z1331 Encounter for screening for depression: Secondary | ICD-10-CM | POA: Diagnosis not present

## 2018-10-10 DIAGNOSIS — E1140 Type 2 diabetes mellitus with diabetic neuropathy, unspecified: Secondary | ICD-10-CM | POA: Diagnosis not present

## 2018-10-10 DIAGNOSIS — I129 Hypertensive chronic kidney disease with stage 1 through stage 4 chronic kidney disease, or unspecified chronic kidney disease: Secondary | ICD-10-CM | POA: Diagnosis not present

## 2018-10-10 DIAGNOSIS — E11319 Type 2 diabetes mellitus with unspecified diabetic retinopathy without macular edema: Secondary | ICD-10-CM | POA: Diagnosis not present

## 2018-10-10 DIAGNOSIS — Z0000 Encounter for general adult medical examination without abnormal findings: Secondary | ICD-10-CM | POA: Diagnosis not present

## 2018-10-10 DIAGNOSIS — E1165 Type 2 diabetes mellitus with hyperglycemia: Secondary | ICD-10-CM | POA: Diagnosis not present

## 2018-10-11 DIAGNOSIS — Z1212 Encounter for screening for malignant neoplasm of rectum: Secondary | ICD-10-CM | POA: Diagnosis not present

## 2018-10-27 ENCOUNTER — Encounter (HOSPITAL_COMMUNITY): Payer: Self-pay | Admitting: Behavioral Health

## 2018-10-27 ENCOUNTER — Inpatient Hospital Stay (HOSPITAL_COMMUNITY)
Admission: RE | Admit: 2018-10-27 | Discharge: 2018-10-31 | DRG: 885 | Disposition: A | Discharge status: Home / Self Care | Payer: No Typology Code available for payment source | Attending: Psychiatry | Admitting: Psychiatry

## 2018-10-27 ENCOUNTER — Other Ambulatory Visit: Payer: Self-pay | Admitting: Registered Nurse

## 2018-10-27 ENCOUNTER — Other Ambulatory Visit: Payer: Self-pay

## 2018-10-27 DIAGNOSIS — Z636 Dependent relative needing care at home: Secondary | ICD-10-CM | POA: Diagnosis not present

## 2018-10-27 DIAGNOSIS — E119 Type 2 diabetes mellitus without complications: Secondary | ICD-10-CM | POA: Diagnosis present

## 2018-10-27 DIAGNOSIS — Z794 Long term (current) use of insulin: Secondary | ICD-10-CM | POA: Diagnosis not present

## 2018-10-27 DIAGNOSIS — G4700 Insomnia, unspecified: Secondary | ICD-10-CM | POA: Diagnosis not present

## 2018-10-27 DIAGNOSIS — F1290 Cannabis use, unspecified, uncomplicated: Secondary | ICD-10-CM | POA: Diagnosis not present

## 2018-10-27 DIAGNOSIS — Z566 Other physical and mental strain related to work: Secondary | ICD-10-CM | POA: Diagnosis not present

## 2018-10-27 DIAGNOSIS — Z91410 Personal history of adult physical and sexual abuse: Secondary | ICD-10-CM

## 2018-10-27 DIAGNOSIS — F410 Panic disorder [episodic paroxysmal anxiety] without agoraphobia: Secondary | ICD-10-CM | POA: Diagnosis present

## 2018-10-27 DIAGNOSIS — I10 Essential (primary) hypertension: Secondary | ICD-10-CM | POA: Diagnosis present

## 2018-10-27 DIAGNOSIS — Z599 Problem related to housing and economic circumstances, unspecified: Secondary | ICD-10-CM

## 2018-10-27 DIAGNOSIS — F333 Major depressive disorder, recurrent, severe with psychotic symptoms: Principal | ICD-10-CM

## 2018-10-27 DIAGNOSIS — Z91411 Personal history of adult psychological abuse: Secondary | ICD-10-CM | POA: Diagnosis not present

## 2018-10-27 DIAGNOSIS — Z818 Family history of other mental and behavioral disorders: Secondary | ICD-10-CM

## 2018-10-27 DIAGNOSIS — R45851 Suicidal ideations: Secondary | ICD-10-CM

## 2018-10-27 DIAGNOSIS — Z62810 Personal history of physical and sexual abuse in childhood: Secondary | ICD-10-CM | POA: Diagnosis present

## 2018-10-27 DIAGNOSIS — F332 Major depressive disorder, recurrent severe without psychotic features: Secondary | ICD-10-CM

## 2018-10-27 DIAGNOSIS — Z79899 Other long term (current) drug therapy: Secondary | ICD-10-CM

## 2018-10-27 LAB — COMPREHENSIVE METABOLIC PANEL
ALT: 14 U/L (ref 0–44)
AST: 17 U/L (ref 15–41)
Albumin: 4 g/dL (ref 3.5–5.0)
Alkaline Phosphatase: 105 U/L (ref 38–126)
Anion gap: 10 (ref 5–15)
BUN: 39 mg/dL — ABNORMAL HIGH (ref 6–20)
CO2: 27 mmol/L (ref 22–32)
Calcium: 10 mg/dL (ref 8.9–10.3)
Chloride: 104 mmol/L (ref 98–111)
Creatinine, Ser: 1.08 mg/dL — ABNORMAL HIGH (ref 0.44–1.00)
GFR calc Af Amer: >60 mL/min (ref >60)
GFR calc non Af Amer: 57 mL/min — ABNORMAL LOW (ref >60)
Glucose, Bld: 250 mg/dL — ABNORMAL HIGH (ref 70–99)
Potassium: 4.4 mmol/L (ref 3.5–5.1)
Sodium: 141 mmol/L (ref 135–145)
Total Bilirubin: 0.4 mg/dL (ref 0.3–1.2)
Total Protein: 7.2 g/dL (ref 6.5–8.1)

## 2018-10-27 LAB — URINALYSIS, COMPLETE (UACMP) WITH MICROSCOPIC
Bilirubin Urine: NEGATIVE
Glucose, UA: >=500 mg/dL — AB
Hgb urine dipstick: NEGATIVE
Ketones, ur: NEGATIVE mg/dL
Leukocytes,Ua: NEGATIVE
Nitrite: NEGATIVE
Protein, ur: 30 mg/dL — AB
Specific Gravity, Urine: 1.024 (ref 1.005–1.030)
pH: 5 (ref 5.0–8.0)

## 2018-10-27 LAB — GLUCOSE, CAPILLARY
Glucose-Capillary: 221 mg/dL — ABNORMAL HIGH (ref 70–99)
Glucose-Capillary: 203 mg/dL — ABNORMAL HIGH (ref 70–99)
Glucose-Capillary: 259 mg/dL — ABNORMAL HIGH (ref 70–99)

## 2018-10-27 LAB — TSH: TSH: 1.695 u[IU]/mL (ref 0.350–4.500)

## 2018-10-27 LAB — LIPID PANEL
Cholesterol: 171 mg/dL (ref 0–200)
HDL: 49 mg/dL (ref >40)
LDL Cholesterol: 59 mg/dL (ref 0–99)
Total CHOL/HDL Ratio: 3.5 RATIO
Triglycerides: 316 mg/dL — ABNORMAL HIGH (ref <150)
VLDL: 63 mg/dL — ABNORMAL HIGH (ref 0–40)

## 2018-10-27 LAB — HEMOGLOBIN A1C
Hgb A1c MFr Bld: 9.1 % — ABNORMAL HIGH (ref 4.8–5.6)
Mean Plasma Glucose: 214.47 mg/dL

## 2018-10-27 LAB — RAPID URINE DRUG SCREEN, HOSP PERFORMED
Amphetamines: NOT DETECTED
Barbiturates: NOT DETECTED
Benzodiazepines: NOT DETECTED
Cocaine: NOT DETECTED
Opiates: NOT DETECTED
Tetrahydrocannabinol: POSITIVE — AB

## 2018-10-27 LAB — CBC
HCT: 40.4 % (ref 36.0–46.0)
Hemoglobin: 13.1 g/dL (ref 12.0–15.0)
MCH: 28.5 pg (ref 26.0–34.0)
MCHC: 32.4 g/dL (ref 30.0–36.0)
MCV: 87.8 fL (ref 80.0–100.0)
Platelets: 250 10*3/uL (ref 150–400)
RBC: 4.6 MIL/uL (ref 3.87–5.11)
RDW: 12.6 % (ref 11.5–15.5)
WBC: 11.3 10*3/uL — ABNORMAL HIGH (ref 4.0–10.5)
nRBC: 0 % (ref 0.0–0.2)

## 2018-10-27 LAB — ETHANOL: Alcohol, Ethyl (B): <10 mg/dL (ref <10)

## 2018-10-27 MED ORDER — LORAZEPAM 0.5 MG PO TABS
0.5000 mg | ORAL_TABLET | Freq: Four times a day (QID) | ORAL | Status: DC | PRN
  Administered 2018-10-27 – 2018-10-30 (×7): 0.5 mg via ORAL
  Filled 2018-10-27 (×7): qty 1

## 2018-10-27 MED ORDER — INSULIN ASPART 100 UNIT/ML ~~LOC~~ SOLN
0.0000 [IU] | Freq: Every day | SUBCUTANEOUS | Status: DC
  Administered 2018-10-27: 3 [IU] via SUBCUTANEOUS
  Administered 2018-10-28 – 2018-10-30 (×3): 2 [IU] via SUBCUTANEOUS

## 2018-10-27 MED ORDER — ATORVASTATIN CALCIUM 40 MG PO TABS
40.0000 mg | ORAL_TABLET | Freq: Every day | ORAL | Status: DC
  Administered 2018-10-28 – 2018-10-30 (×3): 40 mg via ORAL
  Filled 2018-10-27 (×6): qty 1

## 2018-10-27 MED ORDER — LISINOPRIL 20 MG PO TABS
20.0000 mg | ORAL_TABLET | Freq: Every day | ORAL | Status: DC
  Administered 2018-10-28 – 2018-10-31 (×4): 20 mg via ORAL
  Filled 2018-10-27 (×7): qty 1

## 2018-10-27 MED ORDER — ARIPIPRAZOLE 2 MG PO TABS
2.0000 mg | ORAL_TABLET | Freq: Every day | ORAL | Status: DC
  Administered 2018-10-28 – 2018-10-30 (×3): 2 mg via ORAL
  Filled 2018-10-27 (×6): qty 1

## 2018-10-27 MED ORDER — INSULIN ASPART 100 UNIT/ML ~~LOC~~ SOLN
0.0000 [IU] | Freq: Three times a day (TID) | SUBCUTANEOUS | Status: DC
  Administered 2018-10-28: 2 [IU] via SUBCUTANEOUS
  Administered 2018-10-28: 17:00:00 5 [IU] via SUBCUTANEOUS
  Administered 2018-10-28: 3 [IU] via SUBCUTANEOUS
  Administered 2018-10-29: 2 [IU] via SUBCUTANEOUS
  Administered 2018-10-29: 5 [IU] via SUBCUTANEOUS
  Administered 2018-10-29: 8 [IU] via SUBCUTANEOUS
  Administered 2018-10-30: 12:00:00 3 [IU] via SUBCUTANEOUS
  Administered 2018-10-30: 5 [IU] via SUBCUTANEOUS
  Administered 2018-10-31: 2 [IU] via SUBCUTANEOUS

## 2018-10-27 MED ORDER — TRAZODONE HCL 50 MG PO TABS
50.0000 mg | ORAL_TABLET | Freq: Every day | ORAL | Status: DC
  Administered 2018-10-27 – 2018-10-29 (×3): 50 mg via ORAL
  Filled 2018-10-27 (×6): qty 1

## 2018-10-27 MED ORDER — DULOXETINE HCL 60 MG PO CPEP
60.0000 mg | ORAL_CAPSULE | Freq: Every day | ORAL | Status: DC
  Administered 2018-10-28 – 2018-10-31 (×4): 60 mg via ORAL
  Filled 2018-10-27 (×7): qty 1

## 2018-10-27 MED ORDER — INSULIN GLARGINE 100 UNIT/ML ~~LOC~~ SOLN
65.0000 [IU] | Freq: Two times a day (BID) | SUBCUTANEOUS | Status: DC
  Administered 2018-10-27 – 2018-10-28 (×2): 65 [IU] via SUBCUTANEOUS

## 2018-10-27 NOTE — H&P (Signed)
Behavioral Health Medical Screening Exam  [name] is an 58 y.o. female patient presents to [facility] with complaints of worsening depression and anxiety.  Also states that she is hearing voices and having suicidal thoughts.  Patient is unable to contract for safety.  Reporting stressors as her husband disabled, son disable and is also taking care of her elderly father.  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   Total Time spent with patient: 30 minutes  Psychiatric Specialty Exam: Physical Exam  Vitals reviewed. Constitutional: She is oriented to person, place, and time. She appears well-developed and well-nourished.  Obese  Neck: Normal range of motion.  Respiratory: Effort normal.  Musculoskeletal: Normal range of motion.  Neurological: She is alert and oriented to person, place, and time.  Skin: Skin is warm and dry.  Psychiatric: Her speech is normal. Her mood appears anxious. She is actively hallucinating. Cognition and memory are normal. She expresses impulsivity. She exhibits a depressed mood. She expresses suicidal ideation.    Review of Systems  Psychiatric/Behavioral: Positive for depression, hallucinations and suicidal ideas. The patient is nervous/anxious.   All other systems reviewed and are negative.   Blood pressure (!) 165/75, pulse (!) 107, temperature 98.5 F (36.9 C), resp. rate 18, SpO2 96 %.There is no height or weight on file to calculate BMI.  General Appearance: Casual  Eye Contact:  Good  Speech:  Clear and Coherent and Normal Rate  Volume:  Normal  Mood:  Depressed and Hopeless  Affect:  Depressed, Flat and Tearful  Thought Process:  Coherent and Goal Directed  Orientation:  Full (Time, Place, and Person)  Thought Content:  Hallucinations: Auditory  Suicidal Thoughts:  Yes.  without intent/plan  Homicidal Thoughts:  No  Memory:  Immediate;   Good Recent;   Good Remote;    Good  Judgement:  Impaired  Insight:  Fair  Psychomotor Activity:  Decreased  Concentration: Concentration: Fair and Attention Span: Fair  Recall:  Good  Fund of Knowledge:Good  Language: Good  Akathisia:  No  Handed:  Right  AIMS (if indicated):     Assets:  Communication Skills Desire for Improvement Housing Social Support  Sleep:       Musculoskeletal: Strength & Muscle Tone: within normal limits Gait & Station: normal Patient leans: N/A  Blood pressure (!) 165/75, pulse (!) 107, temperature 98.5 F (36.9 C), resp. rate 18, SpO2 96 %.  Recommendations:  Inpatient psychiatric treatment  Based on my evaluation the patient does not appear to have an emergency medical condition.  Shuvon Rankin, NP 10/27/2018, 11:54 AM

## 2018-10-27 NOTE — [facility] Group Notes (Signed)
[facility] Group Notes:  (Nursing/MHT/Case Management/Adjunct)  Date:  10/27/2018  Time:  5:06 PM  Type of Therapy:  Psychoeducational Skills  Participation Level:  Active  Participation Quality:  Appropriate and Attentive  Affect:  Appropriate  Cognitive:  Alert and Appropriate  Insight:  Appropriate and Good  Engagement in Group:  Engaged  Modes of Intervention:  Discussion and Education  Summary of Progress/Problems:  Patient attended and participated.  [name] 10/27/2018, 5:06 PM

## 2018-10-27 NOTE — Progress Notes (Signed)
Admission DAR Note: Pt is a 58 y/o female who came in as a walk in accompanied by her husband. Presents tearful with sullen affect and depressed mood. Denies HI, AVH and pain. Endorsed active SI with plan to "take a bunch of pills, go to sleep and be over with it". Verbally contracts for safety. Reports access to firearms. Per pt "I called my doctor office on zoom told him about my increased depression and he told me to come in to the hospital". 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 Rates her anxiety and depression both 10/10. Reports husband and other relatives (sons & father) are very supportive. Emotional support offered to pt as needed. Encouraged pt to voice concerns, comply with treatment regimen including groups. Unit orientation done, routines discussed and care plan reviewed with pt; understanding verbalized. Safety checks initiated at Q 15 minutes intervals without self harm gestures or outburst. POC implemented for safety and mood stability.

## 2018-10-27 NOTE — Tx Team (Signed)
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  Alteration in appetite "I have been overeating because I'm so depressed".                 DISCHARGE CRITERIA:  Improved stabilization in mood, thinking, and/or behavior Verbal commitment to aftercare and medication compliance  PRELIMINARY DISCHARGE PLAN: Outpatient therapy Return to previous living arrangement  PATIENT/FAMILY INVOLVEMENT: This treatment plan has been presented to and reviewed with the patient. The patient and family have been given the opportunity to ask questions and make suggestions.  Sherryl Manges, RN 10/27/2018, 2:32 PM
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
## 2018-10-27 NOTE — Progress Notes (Signed)
Bilan attended wrap-up group. Pt appears flat/depressed/sad in affect and mood. Pt denies SI/HI/AVH/Pain at this time. Pt appears to isolate herself in room provided with encouragement. Pt states she has difficulty going to sleep; provider on call notified; See MAR. Support provided. Will continue with POC.
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
## 2018-10-28 LAB — GLUCOSE, CAPILLARY
Glucose-Capillary: 129 mg/dL — ABNORMAL HIGH (ref 70–99)
Glucose-Capillary: 153 mg/dL — ABNORMAL HIGH (ref 70–99)
Glucose-Capillary: 201 mg/dL — ABNORMAL HIGH (ref 70–99)
Glucose-Capillary: 209 mg/dL — ABNORMAL HIGH (ref 70–99)

## 2018-10-28 MED ORDER — DULOXETINE HCL 60 MG PO CPEP
60.0000 mg | ORAL_CAPSULE | Freq: Every day | ORAL | Status: DC
  Filled 2018-10-28 (×3): qty 1

## 2018-10-28 MED ORDER — ATORVASTATIN CALCIUM 40 MG PO TABS
40.0000 mg | ORAL_TABLET | Freq: Every day | ORAL | Status: DC
  Filled 2018-10-28 (×3): qty 1

## 2018-10-28 MED ORDER — INSULIN GLARGINE 100 UNIT/ML ~~LOC~~ SOLN: 80.0000 [IU] | Freq: Two times a day (BID) | SUBCUTANEOUS | Status: DC

## 2018-10-28 MED ORDER — INSULIN GLARGINE 100 UNIT/ML ~~LOC~~ SOLN
65.0000 [IU] | Freq: Two times a day (BID) | SUBCUTANEOUS | Status: DC
  Administered 2018-10-28 – 2018-10-31 (×6): 65 [IU] via SUBCUTANEOUS

## 2018-10-28 MED ORDER — ACETAMINOPHEN 325 MG PO TABS
650.0000 mg | ORAL_TABLET | Freq: Four times a day (QID) | ORAL | Status: DC | PRN
  Administered 2018-10-28: 21:00:00 650 mg via ORAL
  Filled 2018-10-28: qty 2

## 2018-10-28 MED ORDER — LISINOPRIL 20 MG PO TABS
20.0000 mg | ORAL_TABLET | Freq: Every day | ORAL | Status: DC
  Filled 2018-10-28 (×3): qty 1

## 2018-10-28 MED ORDER — ALPRAZOLAM 0.5 MG PO TABS
0.2500 mg | ORAL_TABLET | Freq: Two times a day (BID) | ORAL | Status: DC | PRN
  Administered 2018-10-28: 0.25 mg via ORAL
  Filled 2018-10-28: qty 1

## 2018-10-28 NOTE — Progress Notes (Signed)
Pt did not attend group. 

## 2018-10-28 NOTE — Progress Notes (Signed)
[facility] MD Progress Note  10/28/2018 3:38 PM [name]  MRN:  [id number] Subjective: Patient reports some improvement compared to how she felt on admission.  Reports mild sedation related to medications but denies any other side effects at present.  Denies suicidal ideations today.  Objective: I have discussed case with treatment team and have met with patient.  58 year old married female, presented to hospital voluntarily due to worsening depression, anxiety suicidal ideations.  She reports significant stressors including a stressful job (also concerned about coronavirus epidemic, as her job implies frequent contact with people and going to client's houses), and helping take care of her father, who accidentally shot himself last year.  Today patient reports some improvement compared to how she felt at admission.  Describes improving mood, denies suicidal ideations at this time and presents future oriented, stating that she is thinking of taking some time off work/considering going on short-term disability.  She remains ruminative about her stressors but to a lesser degree than on admission. She is currently on Cymbalta and Abilify-overall well tolerated, reports some sedation.  At this time presents fully alert and attentive. CBGs have trended down-today 129 and 153.  No disruptive or agitated behaviors on unit, pleasant on approach.  Limited milieu participation thus far Principal Problem:  MDD Diagnosis: Active Problems:   MDD (major depressive disorder), recurrent, severe, with psychosis ([place])  Total Time spent with patient: 20 minutes  Past Psychiatric History:   Past Medical History:  Past Medical History:  Diagnosis Date  . Anxiety   . Depression   . Diabetes mellitus without complication ([place])   . Hypertension     Past Surgical History:  Procedure Laterality Date  . ACHILLES TENDON SURGERY    . CESAREAN SECTION     Family History:  Family History  Problem Relation Age of  Onset  . Breast cancer Mother   . Hypertension Father   . Diabetes Father   . Transient ischemic attack Father    Family Psychiatric  History:  Social History:  Social History   Substance and Sexual Activity  Alcohol Use No     Social History   Substance and Sexual Activity  Drug Use No    Social History   Socioeconomic History  . Marital status: Married    Spouse name: Not on file  . Number of children: Not on file  . Years of education: Not on file  . Highest education level: Not on file  Occupational History  . Not on file  Social Needs  . Financial resource strain: Not on file  . Food insecurity:    Worry: Not on file    Inability: Not on file  . Transportation needs:    Medical: Not on file    Non-medical: Not on file  Tobacco Use  . Smoking status: Never Smoker  . Smokeless tobacco: Never Used  . Tobacco comment: Per pt "I smoke Marijuana with my husband occassionally".   Substance and Sexual Activity  . Alcohol use: No  . Drug use: No  . Sexual activity: Not on file  Lifestyle  . Physical activity:    Days per week: Not on file    Minutes per session: Not on file  . Stress: Not on file  Relationships  . Social connections:    Talks on phone: Not on file    Gets together: Not on file    Attends religious service: Not on file    Active member of club or  organization: Not on file    Attends meetings of clubs or organizations: Not on file    Relationship status: Not on file  Other Topics Concern  . Not on file  Social History Narrative  . Not on file   Additional Social History:    Pain Medications: see MAR Prescriptions: see MAR Over the Counter: see MAR History of alcohol / drug use?: No history of alcohol / drug abuse Longest period of sobriety (when/how long): N/A  Sleep: Improving  Appetite:  Improving  Current Medications: Current Facility-Administered Medications  Medication Dose Route Frequency Provider Last Rate Last Dose  .  ALPRAZolam (XANAX) tablet 0.25 mg  0.25 mg Oral BID PRN Rankin, Shuvon B, NP   0.25 mg at 10/28/18 1316  . ARIPiprazole (ABILIFY) tablet 2 mg  2 mg Oral Daily Cobos, Fernando A, MD   2 mg at 10/28/18 9480  . atorvastatin (LIPITOR) tablet 40 mg  40 mg Oral q1800 Cobos, Fernando A, MD      . DULoxetine (CYMBALTA) DR capsule 60 mg  60 mg Oral Daily Cobos, Myer Peer, MD   60 mg at 10/28/18 1655  . insulin aspart (novoLOG) injection 0-15 Units  0-15 Units Subcutaneous TID WC Cobos, Myer Peer, MD   3 Units at 10/28/18 1157  . insulin aspart (novoLOG) injection 0-5 Units  0-5 Units Subcutaneous QHS Cobos, Myer Peer, MD   3 Units at 10/27/18 2046  . insulin glargine (LANTUS) injection 80 Units  80 Units Subcutaneous BID Rankin, Shuvon B, NP      . lisinopril (PRINIVIL,ZESTRIL) tablet 20 mg  20 mg Oral Daily Cobos, Myer Peer, MD   20 mg at 10/28/18 0823  . LORazepam (ATIVAN) tablet 0.5 mg  0.5 mg Oral Q6H PRN Cobos, Myer Peer, MD   0.5 mg at 10/28/18 0823  . traZODone (DESYREL) tablet 50 mg  50 mg Oral QHS Laverle Hobby, PA-C   50 mg at 10/27/18 2122    Lab Results:  Results for orders placed or performed during the hospital encounter of 10/27/18 (from the past 48 hour(s))  Glucose, capillary     Status: Abnormal   Collection Time: 10/27/18  1:36 PM  Result Value Ref Range   Glucose-Capillary 221 (H) 70 - 99 mg/dL   Comment 1 Notify RN    Comment 2 Document in Chart   Urinalysis, Complete w Microscopic     Status: Abnormal   Collection Time: 10/27/18  2:55 PM  Result Value Ref Range   Color, Urine YELLOW YELLOW   APPearance HAZY (A) CLEAR   Specific Gravity, Urine 1.024 1.005 - 1.030   pH 5.0 5.0 - 8.0   Glucose, UA >=500 (A) NEGATIVE mg/dL   Hgb urine dipstick NEGATIVE NEGATIVE   Bilirubin Urine NEGATIVE NEGATIVE   Ketones, ur NEGATIVE NEGATIVE mg/dL   Protein, ur 30 (A) NEGATIVE mg/dL   Nitrite NEGATIVE NEGATIVE   Leukocytes,Ua NEGATIVE NEGATIVE   RBC / HPF 0-5 0 - 5 RBC/hpf    WBC, UA 6-10 0 - 5 WBC/hpf   Bacteria, UA RARE (A) NONE SEEN   Squamous Epithelial / LPF 0-5 0 - 5   Mucus PRESENT     Comment: Performed at [facility], [place] [address] [place], [place] [address]  Urine rapid drug screen (hosp performed)not at [facility]     Status: Abnormal   Collection Time: 10/27/18  2:55 PM  Result Value Ref Range   Opiates NONE DETECTED NONE DETECTED  Cocaine NONE DETECTED NONE DETECTED   Benzodiazepines NONE DETECTED NONE DETECTED   Amphetamines NONE DETECTED NONE DETECTED   Tetrahydrocannabinol POSITIVE (A) NONE DETECTED   Barbiturates NONE DETECTED NONE DETECTED    Comment: (NOTE) DRUG SCREEN FOR MEDICAL PURPOSES ONLY.  IF CONFIRMATION IS NEEDED FOR ANY PURPOSE, NOTIFY LAB WITHIN 5 DAYS. LOWEST DETECTABLE LIMITS FOR URINE DRUG SCREEN Drug Class                     Cutoff (ng/mL) Amphetamine and metabolites    1000 Barbiturate and metabolites    200 Benzodiazepine                 528 Tricyclics and metabolites     300 Opiates and metabolites        300 Cocaine and metabolites        300 THC                            50 Performed at [facility], [place] [address], [place], [place] [address]   Glucose, capillary     Status: Abnormal   Collection Time: 10/27/18  4:56 PM  Result Value Ref Range   Glucose-Capillary 203 (H) 70 - 99 mg/dL   Comment 1 Notify RN    Comment 2 Document in Chart   CBC     Status: Abnormal   Collection Time: 10/27/18  6:34 PM  Result Value Ref Range   WBC 11.3 (H) 4.0 - 10.5 K/uL   RBC 4.60 3.87 - 5.11 MIL/uL   Hemoglobin 13.1 12.0 - 15.0 g/dL   HCT 40.4 36.0 - 46.0 %   MCV 87.8 80.0 - 100.0 fL   MCH 28.5 26.0 - 34.0 pg   MCHC 32.4 30.0 - 36.0 g/dL   RDW 12.6 11.5 - 15.5 %   Platelets 250 150 - 400 K/uL   nRBC 0.0 0.0 - 0.2 %    Comment: Performed at [facility], [place] [address], [place], [place] [address]  Comprehensive metabolic panel     Status: Abnormal    Collection Time: 10/27/18  6:34 PM  Result Value Ref Range   Sodium 141 135 - 145 mmol/L   Potassium 4.4 3.5 - 5.1 mmol/L   Chloride 104 98 - 111 mmol/L   CO2 27 22 - 32 mmol/L   Glucose, Bld 250 (H) 70 - 99 mg/dL   BUN 39 (H) 6 - 20 mg/dL   Creatinine, Ser 1.08 (H) 0.44 - 1.00 mg/dL   Calcium 10.0 8.9 - 10.3 mg/dL   Total Protein 7.2 6.5 - 8.1 g/dL   Albumin 4.0 3.5 - 5.0 g/dL   AST 17 15 - 41 U/L   ALT 14 0 - 44 U/L   Alkaline Phosphatase 105 38 - 126 U/L   Total Bilirubin 0.4 0.3 - 1.2 mg/dL   GFR calc non Af Amer 57 (L) >60 mL/min   GFR calc Af Amer >60 >60 mL/min   Anion gap 10 5 - 15    Comment: Performed at [facility], [place] [address], [place], [place] [address]  Ethanol     Status: None   Collection Time: 10/27/18  6:34 PM  Result Value Ref Range   Alcohol, Ethyl (B) <10 <10 mg/dL    Comment: (NOTE) Lowest detectable limit for serum alcohol is 10 mg/dL. For medical purposes only. Performed at [facility],  [place] [address] [place], [place] [address]   Hemoglobin A1c     Status: Abnormal   Collection Time: 10/27/18  6:34 PM  Result Value Ref Range   Hgb A1c MFr Bld 9.1 (H) 4.8 - 5.6 %    Comment: (NOTE) Pre diabetes:          5.7%-6.4% Diabetes:              >6.4% Glycemic control for   <7.0% adults with diabetes    Mean Plasma Glucose 214.47 mg/dL    Comment: Performed at [facility] [address], [place], [place] [address]  Lipid panel     Status: Abnormal   Collection Time: 10/27/18  6:34 PM  Result Value Ref Range   Cholesterol 171 0 - 200 mg/dL   Triglycerides 316 (H) <150 mg/dL   HDL 49 >40 mg/dL   Total CHOL/HDL Ratio 3.5 RATIO   VLDL 63 (H) 0 - 40 mg/dL   LDL Cholesterol 59 0 - 99 mg/dL    Comment:        Total Cholesterol/HDL:CHD Risk Coronary Heart Disease Risk Table                     Men   Women  1/2 Average Risk   3.4   3.3  Average Risk       5.0   4.4  2 X Average Risk   9.6   7.1  3 X  Average Risk  23.4   11.0        Use the calculated Patient Ratio above and the CHD Risk Table to determine the patient's CHD Risk.        ATP III CLASSIFICATION (LDL):  <100     mg/dL   Optimal  100-129  mg/dL   Near or Above                    Optimal  130-159  mg/dL   Borderline  160-189  mg/dL   High  >190     mg/dL   Very High Performed at [facility] [address] [place], [place] [address]   TSH     Status: None   Collection Time: 10/27/18  6:34 PM  Result Value Ref Range   TSH 1.695 0.350 - 4.500 uIU/mL    Comment: Performed by a 3rd Generation assay with a functional sensitivity of <=0.01 uIU/mL. Performed at [facility], [place] [address] [place],  [address]   Glucose, capillary     Status: Abnormal   Collection Time: 10/27/18  8:37 PM  Result Value Ref Range   Glucose-Capillary 259 (H) 70 - 99 mg/dL  Glucose, capillary     Status: Abnormal   Collection Time: 10/28/18  6:10 AM  Result Value Ref Range   Glucose-Capillary 129 (H) 70 - 99 mg/dL  Glucose, capillary     Status: Abnormal   Collection Time: 10/28/18 11:50 AM  Result Value Ref Range   Glucose-Capillary 153 (H) 70 - 99 mg/dL   Comment 1 Notify RN    Comment 2 Document in Chart     Blood Alcohol level:  Lab Results  Component Value Date   ETH <10 03/75/4360    Metabolic Disorder Labs: Lab Results  Component Value Date   HGBA1C 9.1 (H) 10/27/2018   MPG 214.47 10/27/2018   No results found for: PROLACTIN Lab Results  Component Value Date  CHOL 171 10/27/2018   TRIG 316 (H) 10/27/2018   HDL 49 10/27/2018   CHOLHDL 3.5 10/27/2018   VLDL 63 (H) 10/27/2018   LDLCALC 59 10/27/2018    Physical Findings: AIMS: Facial and Oral Movements Muscles of Facial Expression: None, normal Lips and Perioral Area: None, normal Jaw: None, normal Tongue: None, normal,Extremity Movements Upper (arms, wrists, hands, fingers): None, normal Lower (legs, knees,  ankles, toes): None, normal, Trunk Movements Neck, shoulders, hips: None, normal, Overall Severity Severity of abnormal movements (highest score from questions above): None, normal Incapacitation due to abnormal movements: None, normal Patient's awareness of abnormal movements (rate only patient's report): No Awareness, Dental Status Current problems with teeth and/or dentures?: No Does patient usually wear dentures?: No  CIWA:    COWS:     Musculoskeletal: Strength & Muscle Tone: within normal limits Gait & Station: normal Patient leans: N/A  Psychiatric Specialty Exam: Physical Exam  ROS no headaches, no chest pain, no shortness of breath, no vomiting  Blood pressure 114/68, pulse 89, temperature 98 F (36.7 C), resp. rate 20, height '5\' 4"'  (1.626 m), weight 116.1 kg, SpO2 98 %.Body mass index is 43.94 kg/m.  General Appearance: Well Groomed  Eye Contact:  Good  Speech:  Normal Rate  Volume:  Normal  Mood:  Reports some improvement, remains depressed/anxious  Affect:  Still depressed, anxious but more reactive, smiles at times appropriately  Thought Process:  Linear and Descriptions of Associations: Intact  Orientation:  Full (Time, Place, and Person)  Thought Content:  No hallucinations, no delusions, ruminative about stressors  Suicidal Thoughts:  No denies suicidal or self-injurious ideations.  Also denies any homicidal or violent ideations  Homicidal Thoughts:  No  Memory:  Recent and remote grossly intact  Judgement:  Fair/improving  Insight:  Fair  Psychomotor Activity:  Normal-no psychomotor restlessness or agitation  Concentration:  Concentration: Good and Attention Span: Good  Recall:  Good  Fund of Knowledge:  Good  Language:  Good  Akathisia:  Negative  Handed:  Right  AIMS (if indicated):     Assets:  Communication Skills Desire for Improvement Resilience  ADL's:  Intact  Cognition:  WNL  Sleep:  Number of Hours: 6.75   Assessment:  58 year old married  female, presented to hospital voluntarily due to worsening depression, anxiety suicidal ideations.  She reports significant stressors including a stressful job (also concerned about coronavirus epidemic, as her job implies frequent contact with people and going to client's houses), and helping take care of her father, who accidentally shot himself last year.   At this time patient presents with some improvement compared to admission.  Mood improving partially but still describes  lingering depression and anxiety and continues to ruminate about stressors . No SI. Tolerating medications well - mild sedation reported  Treatment Plan Summary: Daily contact with patient to assess and evaluate symptoms and progress in treatment, Medication management, Plan inpatient treatment  and medications as below Encourage group and milieu participation to work on coping skills and symptom reduction Continue Xanax 0.25 mgrs BID PRN for anxiety Continue Cymbalta 60 mgrs QDAY for depression, anxiety Continue Abilify 2 mgrs at QHS for antidepressant augmentation ( rationale for QHS is to decrease sedation)  Continue Ativan 0.5 mgs Q 6 hours PRN anxiety Continue Trazodone 50 mgrs QHS PRN for insomnia Continue Lisinopril 20 mgrs QDAY for HTN Continue DM management with Insulin Lantus and Novolog Sliding Scale Treatment team working on disposition planning options  Jenne Campus, MD  10/28/2018, 3:38 PM

## 2018-10-28 NOTE — Progress Notes (Signed)
Recreation Therapy Notes  Date:  4.3.20 Time: 0930 Location: [address] Dayroom  Group Topic: Stress Management  Goal Area(s) Addresses:  Patient will identify positive stress management techniques. Patient will identify benefits of using stress management post d/c.  Intervention: Stress Management  Activity :  Guided Imagery.  LRT read a script that guided patients in envisioning sitting in a field and enjoying summer clouds.  Patients were to follow along as script was read to participate in meditation.  Education:  Stress Management, Discharge Planning.   Education Outcome: Acknowledges Education  Clinical Observations/Feedback:  Pt did not attend group.     Tayvian Holycross, LRT/CTRS        Nichalas Coin A 10/28/2018 11:55 AM 

## 2018-10-28 NOTE — [facility] Group Notes (Signed)
[facility] Group Notes:  (Nursing/MHT/Case Management/Adjunct)  Date:  10/28/2018  Time:  4:00 PM  Type of Therapy:  Music Therapy  Participation Level:  Active  Participation Quality:  Appropriate, Sharing and Supportive  Affect:  Appropriate  Cognitive:  Alert and Appropriate  Insight:  Appropriate and Improving  Engagement in Group:  Developing/Improving and Engaged  Modes of Intervention:  Discussion and Socialization  Summary of Progress/Problems: Patients were invited to share a song with the group that is meaningful to them. Patients were asked why they chose the song and what memories it brought back. Patients also discussed team building.   [name] 10/28/2018, 5:38 PM 

## 2018-10-28 NOTE — Progress Notes (Signed)
Adult Psychoeducational Group Note  Date:  10/28/2018 Time:  8:51 PM  Group Topic/Focus:  Wrap-Up Group:   The focus of this group is to help patients review their daily goal of treatment and discuss progress on daily workbooks.  Participation Level:  Active  Participation Quality:  Appropriate  Affect:  Appropriate  Cognitive:  Alert  Insight: Appropriate  Engagement in Group:  Engaged  Modes of Intervention:  Discussion  Additional Comments:  Pt rated her day 5/10. Pt stated that she got some rest and medication helped to calm her thoughts. Her goal is to stop the "panicky" feelings. Pt also stated that she has been journaling and attending groups.   Kaleen Odea R 10/28/2018, 8:51 PM

## 2018-10-28 NOTE — [facility] Group Notes (Signed)
LCSW Group Therapy Note 10/28/2018 1:46 PM  Type of Therapy/Topic: Group Therapy: Emotion Regulation  Participation Level: Active   Description of Group:  The purpose of this group is to assist patients in learning to regulate negative emotions and experience positive emotions. Patients will be guided to discuss ways in which they have been vulnerable to their negative emotions. These vulnerabilities will be juxtaposed with experiences of positive emotions or situations, and patients will be challenged to use positive emotions to combat negative ones. Special emphasis will be placed on coping with negative emotions in conflict situations, and patients will process healthy conflict resolution skills.  Therapeutic Goals: 1. Patient will identify two positive emotions or experiences to reflect on in order to balance out negative emotions 2. Patient will label two or more emotions that they find the most difficult to experience 3. Patient will demonstrate positive conflict resolution skills through discussion and/or role plays  Summary of Patient Progress:  [name] was engaged and participated throughout the group session. [name] reports that she continues to struggle managing her anxiety, however she feels better than she did when she arrived at the hospital.    Therapeutic Modalities:  Cognitive Behavioral Therapy Feelings Identification Dialectical Behavioral Therapy   Alcario Drought Clinical Social Worker

## 2018-10-28 NOTE — Progress Notes (Signed)
Inpatient Diabetes Program Recommendations  AACE/ADA: New Consensus Statement on Inpatient Glycemic Control (2015)  Target Ranges:  Prepandial:   less than 140 mg/dL      Peak postprandial:   less than 180 mg/dL (1-2 hours)      Critically ill patients:  140 - 180 mg/dL   Lab Results  Component Value Date   GLUCAP 129 (H) 10/28/2018   HGBA1C 9.1 (H) 10/27/2018    Review of Glycemic Control  Diabetes history: Dm2 Outpatient Diabetes medications: Lantus 80 units bid, Humalog 5-25 units tidwc, Jardiance 25 mg QD Current orders for Inpatient glycemic control: Lantus 65 units bid, Novolog 0-15 units tidwc and hs  Hgba1C - 9.1% - uncontrolled  Will likely need meal coverage insulin.  Inpatient Diabetes Program Recommendations:     Add Novolog 8 units tidwc for meal coverage insulin if pt eats > 50% meal. Will need to f/u with PCP for management of her diabetes with HgbA1C > goal of 7%.  Continue to follow.  Thank you. Ailene Ards, RD, LDN, CDE Inpatient Diabetes Coordinator [phone]

## 2018-10-28 NOTE — Tx Team (Signed)
Interdisciplinary Treatment and Diagnostic Plan Update  10/28/2018 Time of Session:  [name] MRN: [id number]  Principal Diagnosis: <principal problem not specified>  Secondary Diagnoses: Active Problems:   MDD (major depressive disorder), recurrent, severe, with psychosis ([place])   Current Medications:  Current Facility-Administered Medications  Medication Dose Route Frequency Provider Last Rate Last Dose  . ARIPiprazole (ABILIFY) tablet 2 mg  2 mg Oral Daily Cobos, Fernando A, MD   2 mg at 10/28/18 2924  . atorvastatin (LIPITOR) tablet 40 mg  40 mg Oral q1800 Cobos, Fernando A, MD      . DULoxetine (CYMBALTA) DR capsule 60 mg  60 mg Oral Daily Cobos, Myer Peer, MD   60 mg at 10/28/18 4628  . insulin aspart (novoLOG) injection 0-15 Units  0-15 Units Subcutaneous TID WC Cobos, Myer Peer, MD   2 Units at 10/28/18 0615  . insulin aspart (novoLOG) injection 0-5 Units  0-5 Units Subcutaneous QHS Cobos, Myer Peer, MD   3 Units at 10/27/18 2046  . insulin glargine (LANTUS) injection 65 Units  65 Units Subcutaneous BID Cobos, Myer Peer, MD   65 Units at 10/28/18 0827  . lisinopril (PRINIVIL,ZESTRIL) tablet 20 mg  20 mg Oral Daily Cobos, Myer Peer, MD   20 mg at 10/28/18 0823  . LORazepam (ATIVAN) tablet 0.5 mg  0.5 mg Oral Q6H PRN Cobos, Myer Peer, MD   0.5 mg at 10/28/18 0823  . traZODone (DESYREL) tablet 50 mg  50 mg Oral QHS Laverle Hobby, PA-C   50 mg at 10/27/18 2122   PTA Medications: Medications Prior to Admission  Medication Sig Dispense Refill Last Dose  . ALPRAZolam (XANAX) 0.25 MG tablet Take 0.25 mg by mouth 2 (two) times daily as needed for anxiety.    Taking  . atorvastatin (LIPITOR) 40 MG tablet Take 40 mg by mouth daily.     Marland Kitchen buPROPion (WELLBUTRIN XL) 150 MG 24 hr tablet Take 150 mg by mouth daily.     . DULoxetine (CYMBALTA) 60 MG capsule Take 60 mg by mouth daily.   Taking  . insulin glargine (LANTUS) 100 UNIT/ML injection Inject 80 Units into the skin 2 (two)  times daily.    Taking  . insulin lispro (HUMALOG) 100 UNIT/ML injection Inject into the skin as needed for high blood sugar (5 units-25 units per sliding scale).     Marland Kitchen JARDIANCE 25 MG TABS tablet Take 25 mg by mouth daily.     Marland Kitchen lisinopril (PRINIVIL,ZESTRIL) 20 MG tablet Take 20 mg by mouth daily.       Patient Stressors: Loss of "most of my retirement fund in the [place]" Occupational concerns Traumatic event  Patient Strengths: Ability for insight Capable of independent living Communication skills Supportive family/friends Work skills  Treatment Modalities: Medication Management, Group therapy, Case management,  1 to 1 session with clinician, Psychoeducation, Recreational therapy.   Physician Treatment Plan for Primary Diagnosis: <principal problem not specified> Long Term Goal(s): Improvement in symptoms so as ready for discharge Improvement in symptoms so as ready for discharge   Short Term Goals: Ability to identify changes in lifestyle to reduce recurrence of condition will improve Ability to maintain clinical measurements within normal limits will improve Ability to identify changes in lifestyle to reduce recurrence of condition will improve Ability to verbalize feelings will improve Ability to disclose and discuss suicidal ideas Ability to demonstrate self-control will improve Ability to identify and develop effective coping behaviors will improve Ability to maintain clinical  measurements within normal limits will improve  Medication Management: Evaluate patient's response, side effects, and tolerance of medication regimen.  Therapeutic Interventions: 1 to 1 sessions, Unit Group sessions and Medication administration.  Evaluation of Outcomes: Not Met  Physician Treatment Plan for Secondary Diagnosis: Active Problems:   MDD (major depressive disorder), recurrent, severe, with psychosis ([place])  Long Term Goal(s): Improvement in symptoms so as ready for  discharge Improvement in symptoms so as ready for discharge   Short Term Goals: Ability to identify changes in lifestyle to reduce recurrence of condition will improve Ability to maintain clinical measurements within normal limits will improve Ability to identify changes in lifestyle to reduce recurrence of condition will improve Ability to verbalize feelings will improve Ability to disclose and discuss suicidal ideas Ability to demonstrate self-control will improve Ability to identify and develop effective coping behaviors will improve Ability to maintain clinical measurements within normal limits will improve     Medication Management: Evaluate patient's response, side effects, and tolerance of medication regimen.  Therapeutic Interventions: 1 to 1 sessions, Unit Group sessions and Medication administration.  Evaluation of Outcomes: Not Met   RN Treatment Plan for Primary Diagnosis: <principal problem not specified> Long Term Goal(s): Knowledge of disease and therapeutic regimen to maintain health will improve  Short Term Goals: Ability to participate in decision making will improve, Ability to verbalize feelings will improve, Ability to disclose and discuss suicidal ideas, Ability to identify and develop effective coping behaviors will improve and Compliance with prescribed medications will improve  Medication Management: RN will administer medications as ordered by provider, will assess and evaluate patient's response and provide education to patient for prescribed medication. RN will report any adverse and/or side effects to prescribing provider.  Therapeutic Interventions: 1 on 1 counseling sessions, Psychoeducation, Medication administration, Evaluate responses to treatment, Monitor vital signs and CBGs as ordered, Perform/monitor CIWA, COWS, AIMS and Fall Risk screenings as ordered, Perform wound care treatments as ordered.  Evaluation of Outcomes: Not Met   LCSW Treatment Plan  for Primary Diagnosis: <principal problem not specified> Long Term Goal(s): Safe transition to appropriate next level of care at discharge, Engage patient in therapeutic group addressing interpersonal concerns.  Short Term Goals: Engage patient in aftercare planning with referrals and resources  Therapeutic Interventions: Assess for all discharge needs, 1 to 1 time with Social worker, Explore available resources and support systems, Assess for adequacy in community support network, Educate family and significant other(s) on suicide prevention, Complete Psychosocial Assessment, Interpersonal group therapy.  Evaluation of Outcomes: Not Met   Progress in Treatment: Attending groups: No. Participating in groups: No. Taking medication as prescribed: Yes. Toleration medication: Yes. Family/Significant other contact made: No, will contact:  if patient consents collateral contacts Patient understands diagnosis: Yes. Discussing patient identified problems/goals with staff: Yes. Medical problems stabilized or resolved: Yes. Denies suicidal/homicidal ideation: Yes. Issues/concerns per patient self-inventory: No. Other:   New problem(s) identified: None   New Short Term/Long Term Goal(s): medication stabilization, elimination of SI thoughts, development of comprehensive mental wellness plan.    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   Reason for Continuation of Hospitalization: Depression Medication stabilization Suicidal ideation  Estimated Length of Stay: 11/02/2018  Attendees: Patient: 10/28/2018 10:22 AM  Physician: Dr. Neita Garnet, MD  10/28/2018 10:22 AM  Nursing: [place].Viona Gilmore, RN 10/28/2018 10:22 AM  RN Care Manager: 10/28/2018 10:22 AM  Social Worker: Radonna Ricker, [place] 10/28/2018 10:22 AM   Recreational Therapist:  10/28/2018 10:22 AM  Other:  10/28/2018 10:22 AM  Other:  10/28/2018 10:22 AM  Other: 10/28/2018 10:22 AM    Scribe for Treatment Team: Marylee Floras, [place] 10/28/2018 10:22 AM

## 2018-10-28 NOTE — Progress Notes (Signed)
D: Pt was in bed in her room upon initial approach.  Pt presents with anxious, depressed affect and mood.  She states she is "feeling better than when I got here."  Her goal is to "control negative thoughts."  Pt denies SI/HI, denies hallucinations, reports back pain of 3/10.  Pt has been visible in milieu intermittently tonight.  Pt attended evening group.    A: Introduced self to pt.  Met with pt 1:1.  Actively listened to pt and offered support and encouragement.  Medications administered per order.  PRN medication administered for anxiety and pain.  Q15 minute safety checks maintained.  R: Pt is safe on the unit.  Pt is compliant with medications.  Pt verbally contracts for safety.  Will continue to monitor and assess.
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
## 2018-10-28 NOTE — Progress Notes (Signed)
Pt A & O X4. Denies SI, HI, AVH and pain at this time. Rated her depression 5/10, hopelessness 7/10 and anxiety 8/10 with fair sleep, good appetite, low energy and good concentration level on self inventory sheet. Pt stated during group "I continue to pull myself down by not reaching out to my team of support system, I always do for others and put myself back" and will like to change that. Pt's goal for today is to "stop the negative thought". Pt attended 75% of groups and was actively engaged in activities. Compliant with medications. Denies side effects.  Emotional support and encouragement offered to as needed this shift. Scheduled and PRN medications administered per provider's orders with verbal education and effects monitored. Safety checks continues at Q 15 minutes intervals.  Pt tolerates meals and fluids well. Denies concerns at this time. Pt remains safe on and off unit.

## 2018-10-29 LAB — GLUCOSE, CAPILLARY
Glucose-Capillary: 125 mg/dL — ABNORMAL HIGH (ref 70–99)
Glucose-Capillary: 265 mg/dL — ABNORMAL HIGH (ref 70–99)
Glucose-Capillary: 235 mg/dL — ABNORMAL HIGH (ref 70–99)
Glucose-Capillary: 213 mg/dL — ABNORMAL HIGH (ref 70–99)

## 2018-10-29 NOTE — [facility] Group Notes (Signed)
[facility] LCSW Group Therapy Note  Date/Time:   10/29/2018 10:00-11:00AM  Type of Therapy and Topic:  Group Therapy:  Healthy vs Unhealthy Coping Skills during Social Distancing  Participation Level:  Active   Description of Group:  The focus of this group was to determine what unhealthy coping techniques typically are used by group members and what healthy coping techniques would be helpful in coping with various problems, with an emphasis on changes in healthy/unhealthy coping skills during the current pandemic of coronavirus.  Questions were posed and answered about how to cope with specific situations, and group members were encouraged to have honest communication with others while they are calm, as well as to plan out what they want to say, even having a written agenda or a written list of boundaries they want to establish.  Therapeutic Goals 1. Patients defined and discussed healthy vs unhealthy coping techniques 2. Patients identified their recent coping techniques and identified whether these were healthy or unhealthy 3. Patients provided support and ideas to each other, determining that they share much in common despite having felt so alone while at home 4. Patients will learn the benefit of using "I" messages when communicating their needs to others  Summary of Patient Progress: During group, patient expressed that she has a huge number of heavy responsibilities, but the current pandemic has kind of been the "straw that broke the camel's back."  Her father who has dementia has lived with them for 10 years and is very unreasonable at times in his demands, such as filling her house with motors that he buys.  Her husband is on disability and she has to be the bread winner.  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  Her boss is demanding more from her than usual and she ends up crying whenever they  talk.  She feels responsible for letting "everyone" down and has a difficult time seeing that her own needs matter.  She got a lot of good feedback from other patients about this.   Therapeutic Modalities Cognitive Behavioral Therapy Motivational Interviewing   Ambrose Mantle, LCSW 10/29/2018, 5:17 PM

## 2018-10-29 NOTE — Progress Notes (Signed)
[facility] MD Progress Note  10/29/2018 8:55 AM [name]  MRN:  [id number] Subjective: patient reports she is feeling " a lot better" this morning , and states she notices she has been anxious and less ruminative about stressors. States " I have been able to enjoy talking to other patients and journaling".  Does not currently endorse medication side effects. No suicidal ideations.   Objective:  I have met with patient and have met with patient.  58 year old married female, presented to hospital voluntarily due to worsening depression, anxiety suicidal ideations.  She reports significant stressors including a stressful job (also concerned about coronavirus epidemic, as her job implies frequent contact with people and going to client's houses), and helping take care of her father, who accidentally shot himself last year.   Patient reports she is feeling better today, and describes improving depression and anxiety. Thus far tolerating medications well , denies side effects and has tolerated Cymbalta/ Abilify well thus far  Denies suicidal ideations.  CBG today 125 .  No agitated or disruptive behaviors, visible in day room, interacting appropriately with peers, pleasant on approach.  Principal Problem:  MDD Diagnosis: Active Problems:   MDD (major depressive disorder), recurrent, severe, with psychosis ([place])  Total Time spent with patient: 15 minutes  Past Psychiatric History:   Past Medical History:  Past Medical History:  Diagnosis Date  . Anxiety   . Depression   . Diabetes mellitus without complication ([place])   . Hypertension     Past Surgical History:  Procedure Laterality Date  . ACHILLES TENDON SURGERY    . CESAREAN SECTION     Family History:  Family History  Problem Relation Age of Onset  . Breast cancer Mother   . Hypertension Father   . Diabetes Father   . Transient ischemic attack Father    Family Psychiatric  History:  Social History:  Social History    Substance and Sexual Activity  Alcohol Use No     Social History   Substance and Sexual Activity  Drug Use No    Social History   Socioeconomic History  . Marital status: Married    Spouse name: Not on file  . Number of children: Not on file  . Years of education: Not on file  . Highest education level: Not on file  Occupational History  . Not on file  Social Needs  . Financial resource strain: Not on file  . Food insecurity:    Worry: Not on file    Inability: Not on file  . Transportation needs:    Medical: Not on file    Non-medical: Not on file  Tobacco Use  . Smoking status: Never Smoker  . Smokeless tobacco: Never Used  . Tobacco comment: Per pt "I smoke Marijuana with my husband occassionally".   Substance and Sexual Activity  . Alcohol use: No  . Drug use: No  . Sexual activity: Not on file  Lifestyle  . Physical activity:    Days per week: Not on file    Minutes per session: Not on file  . Stress: Not on file  Relationships  . Social connections:    Talks on phone: Not on file    Gets together: Not on file    Attends religious service: Not on file    Active member of club or organization: Not on file    Attends meetings of clubs or organizations: Not on file    Relationship status: Not on file  Other Topics Concern  . Not on file  Social History Narrative  . Not on file   Additional Social History:    Pain Medications: see MAR Prescriptions: see MAR Over the Counter: see MAR History of alcohol / drug use?: No history of alcohol / drug abuse Longest period of sobriety (when/how long): N/A  Sleep: reports " I slept a lot better" last night   Appetite:  Improving  Current Medications: Current Facility-Administered Medications  Medication Dose Route Frequency Provider Last Rate Last Dose  . acetaminophen (TYLENOL) tablet 650 mg  650 mg Oral Q6H PRN Lindon Romp A, NP   650 mg at 10/28/18 2111  . ALPRAZolam (XANAX) tablet 0.25 mg  0.25 mg  Oral BID PRN Rankin, Shuvon B, NP   0.25 mg at 10/28/18 1316  . ARIPiprazole (ABILIFY) tablet 2 mg  2 mg Oral Daily , Myer Peer, MD   2 mg at 10/29/18 0816  . atorvastatin (LIPITOR) tablet 40 mg  40 mg Oral q1800 , Myer Peer, MD   40 mg at 10/28/18 1715  . DULoxetine (CYMBALTA) DR capsule 60 mg  60 mg Oral Daily , Myer Peer, MD   60 mg at 10/29/18 0815  . insulin aspart (novoLOG) injection 0-15 Units  0-15 Units Subcutaneous TID WC , Myer Peer, MD   2 Units at 10/29/18 0615  . insulin aspart (novoLOG) injection 0-5 Units  0-5 Units Subcutaneous QHS , Myer Peer, MD   2 Units at 10/28/18 2111  . insulin glargine (LANTUS) injection 65 Units  65 Units Subcutaneous BID , Myer Peer, MD   65 Units at 10/29/18 [phone]  . lisinopril (PRINIVIL,ZESTRIL) tablet 20 mg  20 mg Oral Daily , Myer Peer, MD   20 mg at 10/29/18 0815  . LORazepam (ATIVAN) tablet 0.5 mg  0.5 mg Oral Q6H PRN , Myer Peer, MD   0.5 mg at 10/28/18 2111  . traZODone (DESYREL) tablet 50 mg  50 mg Oral QHS Laverle Hobby, PA-C   50 mg at 10/28/18 2111    Lab Results:  Results for orders placed or performed during the hospital encounter of 10/27/18 (from the past 48 hour(s))  Glucose, capillary     Status: Abnormal   Collection Time: 10/27/18  1:36 PM  Result Value Ref Range   Glucose-Capillary 221 (H) 70 - 99 mg/dL   Comment 1 Notify RN    Comment 2 Document in Chart   Urinalysis, Complete w Microscopic     Status: Abnormal   Collection Time: 10/27/18  2:55 PM  Result Value Ref Range   Color, Urine YELLOW YELLOW   APPearance HAZY (A) CLEAR   Specific Gravity, Urine 1.024 1.005 - 1.030   pH 5.0 5.0 - 8.0   Glucose, UA >=500 (A) NEGATIVE mg/dL   Hgb urine dipstick NEGATIVE NEGATIVE   Bilirubin Urine NEGATIVE NEGATIVE   Ketones, ur NEGATIVE NEGATIVE mg/dL   Protein, ur 30 (A) NEGATIVE mg/dL   Nitrite NEGATIVE NEGATIVE   Leukocytes,Ua NEGATIVE NEGATIVE   RBC / HPF 0-5 0 - 5 RBC/hpf    WBC, UA 6-10 0 - 5 WBC/hpf   Bacteria, UA RARE (A) NONE SEEN   Squamous Epithelial / LPF 0-5 0 - 5   Mucus PRESENT     Comment: Performed at [facility], [place] [address], [place], [place] [address]  Urine rapid drug screen (hosp performed)not at [facility]     Status: Abnormal   Collection Time: 10/27/18  2:55  PM  Result Value Ref Range   Opiates NONE DETECTED NONE DETECTED   Cocaine NONE DETECTED NONE DETECTED   Benzodiazepines NONE DETECTED NONE DETECTED   Amphetamines NONE DETECTED NONE DETECTED   Tetrahydrocannabinol POSITIVE (A) NONE DETECTED   Barbiturates NONE DETECTED NONE DETECTED    Comment: (NOTE) DRUG SCREEN FOR MEDICAL PURPOSES ONLY.  IF CONFIRMATION IS NEEDED FOR ANY PURPOSE, NOTIFY LAB WITHIN 5 DAYS. LOWEST DETECTABLE LIMITS FOR URINE DRUG SCREEN Drug Class                     Cutoff (ng/mL) Amphetamine and metabolites    1000 Barbiturate and metabolites    200 Benzodiazepine                 280 Tricyclics and metabolites     300 Opiates and metabolites        300 Cocaine and metabolites        300 THC                            50 Performed at [facility], [place] [address] [place], [place] [address]   Glucose, capillary     Status: Abnormal   Collection Time: 10/27/18  4:56 PM  Result Value Ref Range   Glucose-Capillary 203 (H) 70 - 99 mg/dL   Comment 1 Notify RN    Comment 2 Document in Chart   CBC     Status: Abnormal   Collection Time: 10/27/18  6:34 PM  Result Value Ref Range   WBC 11.3 (H) 4.0 - 10.5 K/uL   RBC 4.60 3.87 - 5.11 MIL/uL   Hemoglobin 13.1 12.0 - 15.0 g/dL   HCT 40.4 36.0 - 46.0 %   MCV 87.8 80.0 - 100.0 fL   MCH 28.5 26.0 - 34.0 pg   MCHC 32.4 30.0 - 36.0 g/dL   RDW 12.6 11.5 - 15.5 %   Platelets 250 150 - 400 K/uL   nRBC 0.0 0.0 - 0.2 %    Comment: Performed at [facility] [place], [place] [address] [place], [place] [address]  Comprehensive metabolic panel     Status: Abnormal    Collection Time: 10/27/18  6:34 PM  Result Value Ref Range   Sodium 141 135 - 145 mmol/L   Potassium 4.4 3.5 - 5.1 mmol/L   Chloride 104 98 - 111 mmol/L   CO2 27 22 - 32 mmol/L   Glucose, Bld 250 (H) 70 - 99 mg/dL   BUN 39 (H) 6 - 20 mg/dL   Creatinine, Ser 1.08 (H) 0.44 - 1.00 mg/dL   Calcium 10.0 8.9 - 10.3 mg/dL   Total Protein 7.2 6.5 - 8.1 g/dL   Albumin 4.0 3.5 - 5.0 g/dL   AST 17 15 - 41 U/L   ALT 14 0 - 44 U/L   Alkaline Phosphatase 105 38 - 126 U/L   Total Bilirubin 0.4 0.3 - 1.2 mg/dL   GFR calc non Af Amer 57 (L) >60 mL/min   GFR calc Af Amer >60 >60 mL/min   Anion gap 10 5 - 15    Comment: Performed at [facility], [place] [address], [place], [place] [address]  Ethanol     Status: None   Collection Time: 10/27/18  6:34 PM  Result Value Ref Range   Alcohol, Ethyl (B) <10 <10 mg/dL    Comment: (NOTE) Lowest detectable limit for  serum alcohol is 10 mg/dL. For medical purposes only. Performed at [facility], [place] [address], [place], [place] [address]   Hemoglobin A1c     Status: Abnormal   Collection Time: 10/27/18  6:34 PM  Result Value Ref Range   Hgb A1c MFr Bld 9.1 (H) 4.8 - 5.6 %    Comment: (NOTE) Pre diabetes:          5.7%-6.4% Diabetes:              >6.4% Glycemic control for   <7.0% adults with diabetes    Mean Plasma Glucose 214.47 mg/dL    Comment: Performed at [place] [address] [place], [place] [address]  Lipid panel     Status: Abnormal   Collection Time: 10/27/18  6:34 PM  Result Value Ref Range   Cholesterol 171 0 - 200 mg/dL   Triglycerides 316 (H) <150 mg/dL   HDL 49 >40 mg/dL   Total CHOL/HDL Ratio 3.5 RATIO   VLDL 63 (H) 0 - 40 mg/dL   LDL Cholesterol 59 0 - 99 mg/dL    Comment:        Total Cholesterol/HDL:CHD Risk Coronary Heart Disease Risk Table                     Men   Women  1/2 Average Risk   3.4   3.3  Average Risk       5.0   4.4  2 X Average Risk   9.6   7.1  3 X  Average Risk  23.4   11.0        Use the calculated Patient Ratio above and the CHD Risk Table to determine the patient's CHD Risk.        ATP III CLASSIFICATION (LDL):  <100     mg/dL   Optimal  100-129  mg/dL   Near or Above                    Optimal  130-159  mg/dL   Borderline  160-189  mg/dL   High  >190     mg/dL   Very High Performed at [place] [address] [place], [place] [address]   TSH     Status: None   Collection Time: 10/27/18  6:34 PM  Result Value Ref Range   TSH 1.695 0.350 - 4.500 uIU/mL    Comment: Performed by a 3rd Generation assay with a functional sensitivity of <=0.01 uIU/mL. Performed at [facility], [place] [address] [place], [place] [address]   Glucose, capillary     Status: Abnormal   Collection Time: 10/27/18  8:37 PM  Result Value Ref Range   Glucose-Capillary 259 (H) 70 - 99 mg/dL  Glucose, capillary     Status: Abnormal   Collection Time: 10/28/18  6:10 AM  Result Value Ref Range   Glucose-Capillary 129 (H) 70 - 99 mg/dL  Glucose, capillary     Status: Abnormal   Collection Time: 10/28/18 11:50 AM  Result Value Ref Range   Glucose-Capillary 153 (H) 70 - 99 mg/dL   Comment 1 Notify RN    Comment 2 Document in Chart   Glucose, capillary     Status: Abnormal   Collection Time: 10/28/18  5:01 PM  Result Value Ref Range   Glucose-Capillary 201 (H) 70 - 99 mg/dL   Comment 1 Notify RN   Glucose, capillary  Status: Abnormal   Collection Time: 10/28/18  8:14 PM  Result Value Ref Range   Glucose-Capillary 209 (H) 70 - 99 mg/dL  Glucose, capillary     Status: Abnormal   Collection Time: 10/29/18  6:04 AM  Result Value Ref Range   Glucose-Capillary 125 (H) 70 - 99 mg/dL    Blood Alcohol level:  Lab Results  Component Value Date   ETH <10 38/04/1750    Metabolic Disorder Labs: Lab Results  Component Value Date   HGBA1C 9.1 (H) 10/27/2018   MPG 214.47 10/27/2018   No results found for:  PROLACTIN Lab Results  Component Value Date   CHOL 171 10/27/2018   TRIG 316 (H) 10/27/2018   HDL 49 10/27/2018   CHOLHDL 3.5 10/27/2018   VLDL 63 (H) 10/27/2018   LDLCALC 59 10/27/2018    Physical Findings: AIMS: Facial and Oral Movements Muscles of Facial Expression: None, normal Lips and Perioral Area: None, normal Jaw: None, normal Tongue: None, normal,Extremity Movements Upper (arms, wrists, hands, fingers): None, normal Lower (legs, knees, ankles, toes): None, normal, Trunk Movements Neck, shoulders, hips: None, normal, Overall Severity Severity of abnormal movements (highest score from questions above): None, normal Incapacitation due to abnormal movements: None, normal Patient's awareness of abnormal movements (rate only patient's report): No Awareness, Dental Status Current problems with teeth and/or dentures?: No Does patient usually wear dentures?: No  CIWA:    COWS:     Musculoskeletal: Strength & Muscle Tone: within normal limits Gait & Station: normal Patient leans: N/A  Psychiatric Specialty Exam: Physical Exam  ROS no headaches, no chest pain, no shortness of breath, no coughing , no vomiting, no fever or chills  Blood pressure 123/62, pulse 81, temperature 99 F (37.2 C), temperature source Oral, resp. rate 20, height '5\' 4"'  (1.626 m), weight 116.1 kg, SpO2 98 %.Body mass index is 43.94 kg/m.  General Appearance: Well Groomed  Eye Contact:  Good  Speech:  Normal Rate  Volume:  Normal  Mood:  improving mood , reports feeling better  Affect:  more reactive today, fuller in range, presents less anxious   Thought Process:  Linear and Descriptions of Associations: Intact  Orientation:  Full (Time, Place, and Person)  Thought Content:  No hallucinations, no delusions, ruminative about stressors  Suicidal Thoughts:  No denies suicidal or self-injurious ideations.  Also denies any homicidal or violent ideations  Homicidal Thoughts:  No  Memory:  Recent and  remote grossly intact  Judgement:  improving  Insight:  improving   Psychomotor Activity:  Normal-no psychomotor restlessness or agitation  Concentration:  Concentration: Good and Attention Span: Good  Recall:  Good  Fund of Knowledge:  Good  Language:  Good  Akathisia:  Negative  Handed:  Right  AIMS (if indicated):     Assets:  Communication Skills Desire for Improvement Resilience  ADL's:  Intact  Cognition:  WNL  Sleep:  Number of Hours: 6.5   Assessment:  58 year old married female, presented to hospital voluntarily due to worsening depression, anxiety suicidal ideations.  She reports significant stressors including a stressful job (also concerned about coronavirus epidemic, as her job implies frequent contact with people and going to client's houses), and helping take care of her father, who accidentally shot himself last year.   Patient is reporting partial improvement since admission. Reports feeling less depressed and less anxious/ruminative . Denies suicidal ideations at this time. Denies medication side effects.   Treatment Plan Summary: Daily contact with patient to assess  and evaluate symptoms and progress in treatment, Medication management, Plan inpatient treatment  and medications as below  Treatment plan reviewed as below today 4/4 Encourage group and milieu participation to work on coping skills and symptom reduction D/C  Xanax  Continue Cymbalta 60 mgrs QDAY for depression, anxiety Continue Abilify 2 mgrs at QHS for antidepressant augmentation ( rationale for QHS is to decrease sedation)  Continue Ativan 0.5 mgs Q 6 hours PRN anxiety Continue Trazodone 50 mgrs QHS PRN for insomnia Continue Lisinopril 20 mgrs QDAY for HTN Continue DM management with Insulin Lantus and Novolog Sliding Scale Treatment team working on disposition planning options  Jenne Campus, MD 10/29/2018, 8:55 AM   Patient ID: [name], female   DOB: 13-Aug-1960, 58 y.o.   MRN:  [id number]

## 2018-10-29 NOTE — Progress Notes (Signed)
Adult Psychoeducational Group Note  Date:  10/29/2018 Time:  8:34 PM  Group Topic/Focus:  Wrap-Up Group:   The focus of this group is to help patients review their daily goal of treatment and discuss progress on daily workbooks.  Participation Level:  Active  Participation Quality:  Appropriate  Affect:  Appropriate  Cognitive:  Alert  Insight: Appropriate  Engagement in Group:  Engaged  Modes of Intervention:  Discussion  Additional Comments:  Pt rated her day 6/10, which was better than yesterday. For self-care, pt wrote in her journal and was able to go outside for recreation. Pt's goal for today was to work on controlling her thoughts. Pt stated that she made some progress on her goal.  Flonnie Hailstone 10/29/2018, 8:34 PM

## 2018-10-29 NOTE — Progress Notes (Addendum)
D. Pt reports improving mood today, still complaining of some anxiety -became a little tearful after group this am, as she was reminded of stressors at home. Pt observed interacting well with peers in milieu  Pt currently denies SI/HI and AV hallucinations. A. Labs and vitals monitored. Pt compliant with medications. Pt supported emotionally and encouraged to express concerns and ask questions.   R. Pt remains safe with 15 minute checks. Will continue POC.

## 2018-10-29 NOTE — Plan of Care (Signed)
°  Problem: Education: °Goal: Emotional status will improve °Outcome: Progressing °Goal: Mental status will improve °Outcome: Progressing °Goal: Verbalization of understanding the information provided will improve °Outcome: Progressing °  °

## 2018-10-29 NOTE — Progress Notes (Signed)
[place] attended wrap-up group. Pt appears anxious/depressed in affect and mood. Pt brightens on approach.Pt denies SI/HI/AVH/Pain at this time. Pt states she had a good day. Pt states group with social worker about coping mechanisms was helpful. Pt states she got to go outside and enjoy the weather. Pt states she tends to holds her emotions in which she is working on. Pt rates good sleep with current bedtime regimen. Support provided and encouragement. Will continue with POC.

## 2018-10-30 LAB — GLUCOSE, CAPILLARY
Glucose-Capillary: 110 mg/dL — ABNORMAL HIGH (ref 70–99)
Glucose-Capillary: 174 mg/dL — ABNORMAL HIGH (ref 70–99)
Glucose-Capillary: 221 mg/dL — ABNORMAL HIGH (ref 70–99)
Glucose-Capillary: 205 mg/dL — ABNORMAL HIGH (ref 70–99)

## 2018-10-30 MED ORDER — ARIPIPRAZOLE 2 MG PO TABS
2.0000 mg | ORAL_TABLET | Freq: Every day | ORAL | Status: DC
  Filled 2018-10-30 (×3): qty 1

## 2018-10-30 MED ORDER — TRAZODONE HCL 50 MG PO TABS
50.0000 mg | ORAL_TABLET | Freq: Every evening | ORAL | Status: DC | PRN
  Administered 2018-10-30: 21:00:00 50 mg via ORAL
  Filled 2018-10-30: qty 1

## 2018-10-30 NOTE — Progress Notes (Signed)
[facility] MD Progress Note  10/30/2018 11:42 AM BASSHEVA FLURY  MRN:  [id number] Subjective: patient reports some improvement compared to admission but states that today she continues to feel anxious and apprehensive. Has noticed symptoms of depression and anxiety are worse in AM/ tend to improve as day goes on,  and that she wakes up with a subjective  sense of anxiety.  Denies suicidal ideations. Currently does not endorse medication side effects.  Objective:  I have met with patient and have met with patient.  58 year old married female, presented to hospital voluntarily due to worsening depression, anxiety suicidal ideations.  She reports significant stressors including a stressful job (also concerned about coronavirus epidemic, as her job implies frequent contact with people and going to client's houses), and helping take care of her father, who accidentally shot himself last year.   Today reports some improvement compared to how she felt at admission, but reports persistent anxiety, worse in AMs and some ongoing depression. Remains ruminative, worried about her job requiring frequent contact with other people and that she might become infected with coronavirus and then infect her elderly father. Today also spoke about her father " not being a good father when I was growing up, never there for me" and now that he is older /medically ill her being his caretaker. " it is not fair, but I feel it is what I need to do". Today briefly tearful during session . Affect improves partially with support , encouragement, validation. Denies medication side effects, except for mild sedation.  Denies suicidal ideations.  No disruptive or agitated behaviors on unit, pleasant on approach. CBG today 110 Principal Problem:  MDD Diagnosis: Active Problems:   MDD (major depressive disorder), recurrent, severe, with psychosis ([place])  Total Time spent with patient: 20 minutes  Past Psychiatric History:   Past Medical  History:  Past Medical History:  Diagnosis Date  . Anxiety   . Depression   . Diabetes mellitus without complication ([place])   . Hypertension     Past Surgical History:  Procedure Laterality Date  . ACHILLES TENDON SURGERY    . CESAREAN SECTION     Family History:  Family History  Problem Relation Age of Onset  . Breast cancer Mother   . Hypertension Father   . Diabetes Father   . Transient ischemic attack Father    Family Psychiatric  History:  Social History:  Social History   Substance and Sexual Activity  Alcohol Use No     Social History   Substance and Sexual Activity  Drug Use No    Social History   Socioeconomic History  . Marital status: Married    Spouse name: Not on file  . Number of children: Not on file  . Years of education: Not on file  . Highest education level: Not on file  Occupational History  . Not on file  Social Needs  . Financial resource strain: Not on file  . Food insecurity:    Worry: Not on file    Inability: Not on file  . Transportation needs:    Medical: Not on file    Non-medical: Not on file  Tobacco Use  . Smoking status: Never Smoker  . Smokeless tobacco: Never Used  . Tobacco comment: Per pt "I smoke Marijuana with my husband occassionally".   Substance and Sexual Activity  . Alcohol use: No  . Drug use: No  . Sexual activity: Not on file  Lifestyle  . Physical activity:  Days per week: Not on file    Minutes per session: Not on file  . Stress: Not on file  Relationships  . Social connections:    Talks on phone: Not on file    Gets together: Not on file    Attends religious service: Not on file    Active member of club or organization: Not on file    Attends meetings of clubs or organizations: Not on file    Relationship status: Not on file  Other Topics Concern  . Not on file  Social History Narrative  . Not on file   Additional Social History:    Pain Medications: see MAR Prescriptions: see MAR Over  the Counter: see MAR History of alcohol / drug use?: No history of alcohol / drug abuse Longest period of sobriety (when/how long): N/A  Sleep: Fair  Appetite:  Good  Current Medications: Current Facility-Administered Medications  Medication Dose Route Frequency Provider Last Rate Last Dose  . acetaminophen (TYLENOL) tablet 650 mg  650 mg Oral Q6H PRN Lindon Romp A, NP   650 mg at 10/28/18 2111  . ARIPiprazole (ABILIFY) tablet 2 mg  2 mg Oral Daily Cobos, Myer Peer, MD   2 mg at 10/30/18 0754  . atorvastatin (LIPITOR) tablet 40 mg  40 mg Oral q1800 Cobos, Myer Peer, MD   40 mg at 10/29/18 1708  . DULoxetine (CYMBALTA) DR capsule 60 mg  60 mg Oral Daily Cobos, Myer Peer, MD   60 mg at 10/30/18 0751  . insulin aspart (novoLOG) injection 0-15 Units  0-15 Units Subcutaneous TID WC Cobos, Myer Peer, MD   5 Units at 10/29/18 1706  . insulin aspart (novoLOG) injection 0-5 Units  0-5 Units Subcutaneous QHS Cobos, Myer Peer, MD   2 Units at 10/29/18 2131  . insulin glargine (LANTUS) injection 65 Units  65 Units Subcutaneous BID Cobos, Myer Peer, MD   65 Units at 10/30/18 [phone]  . lisinopril (PRINIVIL,ZESTRIL) tablet 20 mg  20 mg Oral Daily Cobos, Myer Peer, MD   20 mg at 10/30/18 0751  . LORazepam (ATIVAN) tablet 0.5 mg  0.5 mg Oral Q6H PRN Cobos, Myer Peer, MD   0.5 mg at 10/30/18 0916  . traZODone (DESYREL) tablet 50 mg  50 mg Oral QHS Laverle Hobby, PA-C   50 mg at 10/29/18 2132    Lab Results:  Results for orders placed or performed during the hospital encounter of 10/27/18 (from the past 48 hour(s))  Glucose, capillary     Status: Abnormal   Collection Time: 10/28/18 11:50 AM  Result Value Ref Range   Glucose-Capillary 153 (H) 70 - 99 mg/dL   Comment 1 Notify RN    Comment 2 Document in Chart   Glucose, capillary     Status: Abnormal   Collection Time: 10/28/18  5:01 PM  Result Value Ref Range   Glucose-Capillary 201 (H) 70 - 99 mg/dL   Comment 1 Notify RN   Glucose,  capillary     Status: Abnormal   Collection Time: 10/28/18  8:14 PM  Result Value Ref Range   Glucose-Capillary 209 (H) 70 - 99 mg/dL  Glucose, capillary     Status: Abnormal   Collection Time: 10/29/18  6:04 AM  Result Value Ref Range   Glucose-Capillary 125 (H) 70 - 99 mg/dL  Glucose, capillary     Status: Abnormal   Collection Time: 10/29/18 11:54 AM  Result Value Ref Range   Glucose-Capillary 265 (H) 70 -  99 mg/dL  Glucose, capillary     Status: Abnormal   Collection Time: 10/29/18  4:56 PM  Result Value Ref Range   Glucose-Capillary 235 (H) 70 - 99 mg/dL   Comment 1 Notify RN   Glucose, capillary     Status: Abnormal   Collection Time: 10/29/18  8:06 PM  Result Value Ref Range   Glucose-Capillary 213 (H) 70 - 99 mg/dL  Glucose, capillary     Status: Abnormal   Collection Time: 10/30/18  5:51 AM  Result Value Ref Range   Glucose-Capillary 110 (H) 70 - 99 mg/dL   Comment 1 Notify RN    Comment 2 Document in Chart     Blood Alcohol level:  Lab Results  Component Value Date   ETH <10 41/32/4401    Metabolic Disorder Labs: Lab Results  Component Value Date   HGBA1C 9.1 (H) 10/27/2018   MPG 214.47 10/27/2018   No results found for: PROLACTIN Lab Results  Component Value Date   CHOL 171 10/27/2018   TRIG 316 (H) 10/27/2018   HDL 49 10/27/2018   CHOLHDL 3.5 10/27/2018   VLDL 63 (H) 10/27/2018   LDLCALC 59 10/27/2018    Physical Findings: AIMS: Facial and Oral Movements Muscles of Facial Expression: None, normal Lips and Perioral Area: None, normal Jaw: None, normal Tongue: None, normal,Extremity Movements Upper (arms, wrists, hands, fingers): None, normal Lower (legs, knees, ankles, toes): None, normal, Trunk Movements Neck, shoulders, hips: None, normal, Overall Severity Severity of abnormal movements (highest score from questions above): None, normal Incapacitation due to abnormal movements: None, normal Patient's awareness of abnormal movements (rate  only patient's report): No Awareness, Dental Status Current problems with teeth and/or dentures?: No Does patient usually wear dentures?: No  CIWA:    COWS:     Musculoskeletal: Strength & Muscle Tone: within normal limits Gait & Station: normal Patient leans: N/A  Psychiatric Specialty Exam: Physical Exam  ROS no headaches, no chest pain, no shortness of breath, no coughing , no vomiting, no fever or chills  Blood pressure 123/75, pulse 82, temperature 99 F (37.2 C), temperature source Oral, resp. rate 20, height '5\' 4"'  (1.626 m), weight 116.1 kg, SpO2 98 %.Body mass index is 43.94 kg/m.  General Appearance: Well Groomed  Eye Contact:  Good  Speech:  Normal Rate  Volume:  Normal  Mood:  reports some improvement , but states she still feels depressed, anxious  Affect:  congruent, intermittently tearful, does respond to support and tends to improve during session  Thought Process:  Linear and Descriptions of Associations: Intact  Orientation:  Full (Time, Place, and Person)  Thought Content:  No hallucinations, no delusions, ruminative about stressors  Suicidal Thoughts:  No denies suicidal or self-injurious ideations.  Also denies any homicidal or violent ideations  Homicidal Thoughts:  No  Memory:  Recent and remote grossly intact  Judgement:  improving  Insight:  improving   Psychomotor Activity:  Normal-no psychomotor restlessness or agitation  Concentration:  Concentration: Good and Attention Span: Good  Recall:  Good  Fund of Knowledge:  Good  Language:  Good  Akathisia:  Negative  Handed:  Right  AIMS (if indicated):     Assets:  Communication Skills Desire for Improvement Resilience  ADL's:  Intact  Cognition:  WNL  Sleep:  Number of Hours: 3.5   Assessment:  58 year old married female, presented to hospital voluntarily due to worsening depression, anxiety suicidal ideations.  She reports significant stressors including a stressful job (  also concerned about  coronavirus epidemic, as her job implies frequent contact with people and going to client's houses), and helping take care of her father, who accidentally shot himself last year.   Today describes persistent anxiety/depression, and presents ruminative about stressors, including concerns about her job, about current coronavirus epidemic, and about being caretaker for her medically ill father. Does endorse some improvement compared to admission. Denies SI.  Thus far tolerating medications well, does report some mild sedation.  Treatment Plan Summary: Daily contact with patient to assess and evaluate symptoms and progress in treatment, Medication management, Plan inpatient treatment  and medications as below  Treatment plan reviewed as below today 4/5 Encourage group and milieu participation to work on coping skills and symptom reduction Continue Cymbalta 60 mgrs QDAY for depression, anxiety Continue Abilify 2 mgrs at QHS for antidepressant augmentation ( rationale for QHS is to decrease sedation)  Continue Ativan 0.5 mgs Q 6 hours PRN anxiety Continue Trazodone 50 mgrs QHS PRN for insomnia Continue Lisinopril 20 mgrs QDAY for HTN Continue DM management with Insulin Lantus and Novolog Sliding Scale Treatment team working on disposition planning options  Jenne Campus, MD 10/30/2018, 11:42 AM   Patient ID: Cleda Clarks, female   DOB: 1961-06-05, 58 y.o.   MRN: [id number]
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
## 2018-10-30 NOTE — Progress Notes (Signed)
   10/30/18 0500  Sleep  Number of Hours 3.5
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
## 2018-10-30 NOTE — [facility] Group Notes (Signed)
[facility] LCSW Group Therapy Note  10/30/2018   10:00-11:00AM  Type of Therapy and Topic:  Group Therapy:  Unhealthy versus Healthy Supports, Which Am I?  Participation Level:  Active   Description of Group:  Patients in this group were introduced to the concept that additional supports including self-support are an essential part of recovery.  Initially a discussion was held about the differences between healthy versus unhealthy supports.  Patients were asked to share what unhealthy supports in their lives need to be addressed, as well as what additional healthy supports could be added for greater help in reaching their goals.   A song entitled "My Own Hero" was played and a group discussion ensued in which patients stated they could relate to the song and it inspired them to realize they have be willing to help themselves in order to succeed, because other people cannot achieve sobriety or stability for them.  Two more songs were played that addressed accepting We discussed adding a variety of healthy supports to address the various needs in patient lives, including becoming more self-supportive.  Therapeutic Goals: 1)  Highlight the differences between healthy and unhealthy supports 2)  Suggest the importance of being a part of one's own support system 2)  Discuss reasons people in one's life may eventually be unable to be continually supportive  3)  Identify the patient's current support system and   4)  elicit commitments to add healthy supports and to become more conscious of being self-supportive   Summary of Patient Progress:  The patient expressed that the unhealthy support which needs to be addressed includes her manager at work, her father at home, and her husband who wants to fix everything for her but does not really understand everything she is going through, saying "he is overprotective."  Healthy supports which could be added for increased stability and happiness include a therapist who is  neutral and can be a good person to listen to her problems as well as to assist her with coming up with new skills.  She enjoyed the songs played which concerned believing in oneself, and they made her cry.  She said that her primary care physician Dr. Jacky Kindle at [facility] is uncomfortable with prescribing her psychiatric medications now that they have gotten more complicated, and so she has been referred to a psychiatrist at [facility].  She also said that friends at work will listen to her and encourage her.  She gave no indication she wants to give up her job.    Therapeutic Modalities:   Motivational Interviewing Activity  Lynnell Chad

## 2018-10-30 NOTE — Progress Notes (Signed)
D. Pt presents with an anxious affect/mood upon initial approach this am- reports having slept poorly last night- pt stated that she was struggling with anxiety, but wanted to "journal through it". Pt presented to the med window an hour later requesting ativan for relief. Per pt's self inventory, pt rates her depression, hopelessness and anxiety a 5/4/6, respectively. Pt writes that her goal today is "not blaming myself" and writes that "journaling and eating better" will help her to meet that goal.Pt currently denies SI/HI and AV hallucinations. A. Labs and vitals monitored. Pt compliant with medications. Pt supported emotionally and encouraged to express concerns and ask questions.   R. Pt remains safe with 15 minute checks. Will continue POC.

## 2018-10-31 DIAGNOSIS — F332 Major depressive disorder, recurrent severe without psychotic features: Secondary | ICD-10-CM

## 2018-10-31 LAB — GLUCOSE, CAPILLARY
Glucose-Capillary: 106 mg/dL — ABNORMAL HIGH (ref 70–99)
Glucose-Capillary: 136 mg/dL — ABNORMAL HIGH (ref 70–99)

## 2018-10-31 MED ORDER — LORAZEPAM 0.5 MG PO TABS: 0.5000 mg | ORAL_TABLET | Freq: Four times a day (QID) | ORAL | 0 refills | Status: AC | PRN

## 2018-10-31 MED ORDER — INSULIN GLARGINE 100 UNIT/ML ~~LOC~~ SOLN: 65.0000 [IU] | Freq: Two times a day (BID) | SUBCUTANEOUS | 0 refills | Status: AC

## 2018-10-31 MED ORDER — DULOXETINE HCL 60 MG PO CPEP: 60.0000 mg | ORAL_CAPSULE | Freq: Every day | ORAL | 0 refills | Status: AC

## 2018-10-31 MED ORDER — TRAZODONE HCL 50 MG PO TABS: 50.0000 mg | ORAL_TABLET | Freq: Every evening | ORAL | 0 refills | Status: AC | PRN

## 2018-10-31 MED ORDER — ARIPIPRAZOLE 2 MG PO TABS: 2.0000 mg | ORAL_TABLET | Freq: Every day | ORAL | 0 refills | Status: AC
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
## 2018-10-31 NOTE — Progress Notes (Signed)
  [facility] Adult Case Management Discharge Plan :  Will you be returning to the same living situation after discharge:  Yes,  patient reports she is returning home with her husband and family At discharge, do you have transportation home?: Yes,  patient reports her husband is picking her up  Do you have the ability to pay for your medications: Yes,  BCBS, income from employment  Release of information consent forms completed and in the chart;  Patient's signature needed at discharge.  Patient to Follow up at: Follow-up Information    [facility] Follow up on 11/09/2018.   Why:  Therapy appointment with Leta Jungling is Wednesday, 4/15 at 1:00p. Medication management appointment with Clovis Fredrickson is Monday 4/20 at 12:00p. Appointments will be over the telephone. Please call within 24 hours after discharge to confirm appointments and pay deposit. Contact information: [address] [place] [address] [phone]           Next level of care provider has access to [facility] Link:yes  Safety Planning and Suicide Prevention discussed: Yes,  with the patient's husband  Have you used any form of tobacco in the last 30 days? (Cigarettes, Smokeless Tobacco, Cigars, and/or Pipes): Yes("I smoke Marijuana occassionally with my husband, he has it ordered for pain")  Has patient been referred to the Quitline?: Patient refused referral  Patient has been referred for addiction treatment: Pt. refused referral  Maeola Sarah, LCSWA 10/31/2018, 9:07 AM

## 2018-10-31 NOTE — Progress Notes (Addendum)
D: Patient observed in dayroom with peers watching a movie. Patient states, "I really struggle in the morning. I feel most depressed then. I do better as the day goes on. I think I just wake up and everything hits me. I'm dealing with a lot." Patient's affect flat, anxious and depressed with congruent mood. Denies pain, physical complaints. CBG was "205" and 2 units of regular insulin self administered.  A: Medicated per orders, prn ativan and trazadone given for anxiety and sleep. Patient did not wish to take her abilify as she took it with her morning medications. Suggested she speak with MD about it being switched the HS and patient plans to do so. Medication education provided, particularly about abilify and its help with racing thoughts. Level III obs in place for safety. Emotional support offered. Patient encouraged to complete Suicide Safety Plan before discharge.   R: Patient verbalizes understanding of POC. On reassess, patient was asleep. Patient denies SI/HI/AVH and remains safe on level III obs. Will continue to monitor throughout the night.

## 2018-10-31 NOTE — Progress Notes (Signed)
D:  Patient's self inventory sheet, patient has fair sleep, sleep medication helpful.  Good appetite, normal energy level, good concentration.  Rated depression 3,       hopeless and anxiety #2.  Denied withdrawals.  Denied SI.  Denied physical problems.  Denied physical pain.  Goal is self affirmations, discharge. "STOP in my mind and change thought to affirmation.  Thank you for helping me get thru some of the worst days of my life.  I will be eternally grateful."  Does have discharge plans. A:  Medications administered per MD orders.  Emotional support and encouragement given patient. R:  Safety maintained with 15 minute checks.

## 2018-10-31 NOTE — Progress Notes (Signed)
Adult Psychoeducational Group Note  Date:  10/31/2018 Time:  1:29 PM  Group Topic/Focus:  Developing a Wellness Toolbox:   The focus of this group is to help patients develop a "wellness toolbox" with skills and strategies to promote recovery upon discharge.  Participation Level:  Active  Participation Quality:  Appropriate  Affect:  Appropriate  Cognitive:  Appropriate  Insight: Appropriate  Engagement in Group:  Engaged  Modes of Intervention:  Discussion and Education  Additional Comments:  Pt was able to attend group this morning and shared positively. I also stated that she was glad to be going home.  Twanna Resh E 10/31/2018, 1:29 PM

## 2018-10-31 NOTE — Progress Notes (Signed)
Discharge Note:  Patient discharged home with family member.  Patient denied SI and HI.  Denied A/V hallucinations.  Suicide prevention information given and discussed with patient who stated she understood and had no questions.  Patient stated she received all her belongings, clothing, toiletries, misc items, etc.  Patient stated she appreciated all assistance received from [facility] staff.  All required discharge information given to patient at discharge.  Patient given My3 suicide prevention information at discharge.  Also survey given to patient before she was discharged.   

## 2018-10-31 NOTE — [facility] Suicide Risk Assessment (Addendum)
[facility] Discharge Suicide Risk Assessment   Principal Problem:  Depression Discharge Diagnoses: Active Problems:   MDD (major depressive disorder), recurrent, severe, with psychosis (HCC)   Total Time spent with patient: 30 minutes  Musculoskeletal: Strength & Muscle Tone: within normal limits Gait & Station: normal Patient leans: N/A  Psychiatric Specialty Exam: ROS  No headache, no chest pain, no shortness of breath, no coughing , no fever, no chills  Blood pressure (!) 142/77, pulse 84, temperature 98.1 F (36.7 C), temperature source Oral, resp. rate 18, height 5\' 4"  (1.626 m), weight 116.1 kg, SpO2 98 %.Body mass index is 43.94 kg/m.  General Appearance: improved grooming   Eye Contact::  Good  Speech:  Normal Rate409  Volume:  Normal  Mood:  improving , states " today I feel much better", and states this is the best she has felt in " a long while"  Affect:  Appropriate and more reactive, fuller in range   Thought Process:  Linear and Descriptions of Associations: Intact  Orientation:  Full (Time, Place, and Person)  Thought Content:  no hallucinations, no delusions  Suicidal Thoughts:  No denies suicidal or self injurious ideations   Homicidal Thoughts:  No  Memory:  recent and remote grossly intact   Judgement:  Other:  improving   Insight:  improving   Psychomotor Activity:  Normal  Concentration:  Good  Recall:  Good  Fund of Knowledge:Good  Language: Good  Akathisia:  Negative  Handed:  Right  AIMS (if indicated):     Assets:  Communication Skills Desire for Improvement Resilience  Sleep:  Number of Hours: 6  Cognition: WNL  ADL's:  Intact   Mental Status Per Nursing Assessment::   On Admission:  Suicidal ideation indicated by patient, Suicide plan, Self-harm thoughts  Demographic Factors:  51, married, lives with husband, two adult children, employed   Loss Factors: Stressful job, concerned about job requiring close proximity to others in the context of  coronavirus epidemic, taking care of her elderly father, who shot himself accidentally last year.   Historical Factors: No prior psychiatric admissions, no history of prior suicide attempts , history of depression  Risk Reduction Factors:   Sense of responsibility to family, Employed, Living with another person, especially a relative, Positive social support and Positive coping skills or problem solving skills  Continued Clinical Symptoms:  At this time patient is alert, attentive, well related, pleasant, mood reported as " a lot better", affect appropriate, reactive, brighter, no thought disorder, no suicidal or self injurious ideations, no homicidal or violent ideations, no hallucinations, no delusions, not internally preoccupied  Denies medication side effects. Side effects reviewed, including risk of movement disorders and metabolic side effects related to Abilify Has been visible on unit, interacting appropriately with peers, pleasant on approach. With her express consent I spoke with her husband on the phone, with whom she has maintained phone contact during admission, and who corroborates patient seems much improved and back to baseline, agrees with discharge.    Cognitive Features That Contribute To Risk:  No gross cognitive deficits noted upon discharge. Is alert , attentive, and oriented x 3   Suicide Risk:  Mild:  Suicidal ideation of limited frequency, intensity, duration, and specificity.  There are no identifiable plans, no associated intent, mild dysphoria and related symptoms, good self-control (both objective and subjective assessment), few other risk factors, and identifiable protective factors, including available and accessible social support.  Follow-up Information    [facility]  Follow up on 11/09/2018.   Why:  Therapy appointment with Leta Jungling is Wednesday, 4/15 at 1:00p. Medication management appointment with Clovis Fredrickson is Monday 4/20 at 12:00p. Appointments will  be over the telephone. Please call within 24 hours after discharge to confirm appointments and pay deposit. Contact information: [address] [place] [address] [phone]           Plan Of Care/Follow-up recommendations:  Activity:  as tolerated  Diet:  diabetic diet  Tests:  NA Other:  See below  Patient expressing readiness for discharge and is leaving unit in good spirits , plans to return home.  No current grounds for involuntary commitment . Plans to follow up as above  Plans to follow up with Dr. Jacky Kindle, at [facility] , for ongoing medical /diabetic management   Craige Cotta, MD 10/31/2018, 8:17 AM

## 2018-11-14 DIAGNOSIS — I129 Hypertensive chronic kidney disease with stage 1 through stage 4 chronic kidney disease, or unspecified chronic kidney disease: Secondary | ICD-10-CM | POA: Diagnosis not present

## 2018-11-14 DIAGNOSIS — E1165 Type 2 diabetes mellitus with hyperglycemia: Secondary | ICD-10-CM | POA: Diagnosis not present

## 2018-11-14 DIAGNOSIS — N183 Chronic kidney disease, stage 3 (moderate): Secondary | ICD-10-CM | POA: Diagnosis not present

## 2018-11-14 DIAGNOSIS — R45851 Suicidal ideations: Secondary | ICD-10-CM | POA: Diagnosis not present

## 2018-12-12 DIAGNOSIS — R1011 Right upper quadrant pain: Secondary | ICD-10-CM | POA: Diagnosis not present

## 2018-12-12 DIAGNOSIS — K5900 Constipation, unspecified: Secondary | ICD-10-CM | POA: Diagnosis not present

## 2018-12-12 DIAGNOSIS — E1165 Type 2 diabetes mellitus with hyperglycemia: Secondary | ICD-10-CM | POA: Diagnosis not present

## 2018-12-12 DIAGNOSIS — M546 Pain in thoracic spine: Secondary | ICD-10-CM | POA: Diagnosis not present

## 2018-12-13 DIAGNOSIS — R1011 Right upper quadrant pain: Secondary | ICD-10-CM | POA: Diagnosis not present

## 2019-03-06 DIAGNOSIS — E113593 Type 2 diabetes mellitus with proliferative diabetic retinopathy without macular edema, bilateral: Secondary | ICD-10-CM | POA: Diagnosis not present

## 2019-03-06 DIAGNOSIS — H3563 Retinal hemorrhage, bilateral: Secondary | ICD-10-CM | POA: Diagnosis not present

## 2019-03-06 DIAGNOSIS — H43811 Vitreous degeneration, right eye: Secondary | ICD-10-CM | POA: Diagnosis not present

## 2019-03-27 DIAGNOSIS — E11319 Type 2 diabetes mellitus with unspecified diabetic retinopathy without macular edema: Secondary | ICD-10-CM | POA: Diagnosis not present

## 2019-03-27 DIAGNOSIS — E1140 Type 2 diabetes mellitus with diabetic neuropathy, unspecified: Secondary | ICD-10-CM | POA: Diagnosis not present

## 2019-03-27 DIAGNOSIS — E1139 Type 2 diabetes mellitus with other diabetic ophthalmic complication: Secondary | ICD-10-CM | POA: Diagnosis not present

## 2019-03-27 DIAGNOSIS — E1165 Type 2 diabetes mellitus with hyperglycemia: Secondary | ICD-10-CM | POA: Diagnosis not present

## 2019-05-04 DIAGNOSIS — Z23 Encounter for immunization: Secondary | ICD-10-CM | POA: Diagnosis not present

## 2019-07-17 DIAGNOSIS — F332 Major depressive disorder, recurrent severe without psychotic features: Secondary | ICD-10-CM | POA: Diagnosis not present

## 2019-07-17 DIAGNOSIS — I129 Hypertensive chronic kidney disease with stage 1 through stage 4 chronic kidney disease, or unspecified chronic kidney disease: Secondary | ICD-10-CM | POA: Diagnosis not present

## 2019-07-17 DIAGNOSIS — E1165 Type 2 diabetes mellitus with hyperglycemia: Secondary | ICD-10-CM | POA: Diagnosis not present

## 2019-07-17 DIAGNOSIS — N183 Chronic kidney disease, stage 3 unspecified: Secondary | ICD-10-CM | POA: Diagnosis not present

## 2019-10-16 DIAGNOSIS — E1165 Type 2 diabetes mellitus with hyperglycemia: Secondary | ICD-10-CM | POA: Diagnosis not present

## 2019-10-16 DIAGNOSIS — Z0000 Encounter for general adult medical examination without abnormal findings: Secondary | ICD-10-CM | POA: Diagnosis not present

## 2019-10-16 DIAGNOSIS — E7849 Other hyperlipidemia: Secondary | ICD-10-CM | POA: Diagnosis not present

## 2019-10-23 DIAGNOSIS — I129 Hypertensive chronic kidney disease with stage 1 through stage 4 chronic kidney disease, or unspecified chronic kidney disease: Secondary | ICD-10-CM | POA: Diagnosis not present

## 2019-10-23 DIAGNOSIS — E11319 Type 2 diabetes mellitus with unspecified diabetic retinopathy without macular edema: Secondary | ICD-10-CM | POA: Diagnosis not present

## 2019-10-23 DIAGNOSIS — E1139 Type 2 diabetes mellitus with other diabetic ophthalmic complication: Secondary | ICD-10-CM | POA: Diagnosis not present

## 2019-10-23 DIAGNOSIS — Z0000 Encounter for general adult medical examination without abnormal findings: Secondary | ICD-10-CM | POA: Diagnosis not present

## 2019-10-23 DIAGNOSIS — Z794 Long term (current) use of insulin: Secondary | ICD-10-CM | POA: Diagnosis not present

## 2019-11-30 ENCOUNTER — Other Ambulatory Visit: Payer: Self-pay | Admitting: Internal Medicine

## 2019-11-30 DIAGNOSIS — Z1231 Encounter for screening mammogram for malignant neoplasm of breast: Secondary | ICD-10-CM

## 2020-12-04 DIAGNOSIS — R82998 Other abnormal findings in urine: Secondary | ICD-10-CM | POA: Diagnosis not present

## 2021-11-14 ENCOUNTER — Other Ambulatory Visit: Payer: Self-pay | Admitting: Internal Medicine

## 2021-11-14 DIAGNOSIS — Z1231 Encounter for screening mammogram for malignant neoplasm of breast: Secondary | ICD-10-CM

## 2021-11-27 ENCOUNTER — Ambulatory Visit
Admission: RE | Admit: 2021-11-27 | Discharge: 2021-11-27 | Disposition: A | Discharge status: Home / Self Care | Payer: 59 | Source: Ambulatory Visit | Attending: Internal Medicine | Admitting: Internal Medicine

## 2021-11-27 DIAGNOSIS — Z1231 Encounter for screening mammogram for malignant neoplasm of breast: Secondary | ICD-10-CM

## 2021-11-28 ENCOUNTER — Other Ambulatory Visit: Payer: Self-pay | Admitting: Internal Medicine

## 2021-11-28 DIAGNOSIS — R928 Other abnormal and inconclusive findings on diagnostic imaging of breast: Secondary | ICD-10-CM

## 2021-12-09 ENCOUNTER — Ambulatory Visit
Admission: RE | Admit: 2021-12-09 | Discharge: 2021-12-09 | Disposition: A | Discharge status: Home / Self Care | Payer: 59 | Source: Ambulatory Visit | Attending: Internal Medicine | Admitting: Internal Medicine

## 2021-12-09 DIAGNOSIS — R928 Other abnormal and inconclusive findings on diagnostic imaging of breast: Secondary | ICD-10-CM

## 2021-12-16 ENCOUNTER — Other Ambulatory Visit: Payer: 59

## 2022-01-05 DIAGNOSIS — F329 Major depressive disorder, single episode, unspecified: Secondary | ICD-10-CM | POA: Diagnosis not present

## 2022-01-12 ENCOUNTER — Encounter: Payer: Self-pay | Admitting: *Deleted

## 2022-01-13 ENCOUNTER — Ambulatory Visit (INDEPENDENT_AMBULATORY_CARE_PROVIDER_SITE_OTHER): Payer: PRIVATE HEALTH INSURANCE | Admitting: Neurology

## 2022-01-13 ENCOUNTER — Encounter: Payer: Self-pay | Admitting: Neurology

## 2022-01-13 VITALS — BP 151/71 | HR 68 | Ht 64.0 in | Wt 265.4 lb

## 2022-01-13 DIAGNOSIS — M720 Palmar fascial fibromatosis [Dupuytren]: Secondary | ICD-10-CM

## 2022-01-13 DIAGNOSIS — Z79899 Other long term (current) drug therapy: Secondary | ICD-10-CM

## 2022-01-13 DIAGNOSIS — R2689 Other abnormalities of gait and mobility: Secondary | ICD-10-CM

## 2022-01-13 DIAGNOSIS — R251 Tremor, unspecified: Secondary | ICD-10-CM

## 2022-01-13 DIAGNOSIS — R200 Anesthesia of skin: Secondary | ICD-10-CM

## 2022-01-13 NOTE — Progress Notes (Signed)
Subjective:    Patient ID: [name] is a 61 y.o. female.  HPI    Debbie Foley, MD, PhD [facility] [address] [address] [place], [place] [address]  Dear [name],   I saw your patient, [name], upon your kind request in my neurologic clinic today for initial consultation of her tremors and hand paresthesias.  The patient is accompanied by her husband today.  As you know, [name] is a 61 year old right-handed woman with an underlying complex medical history of hypertension, hyperlipidemia, diabetes, history of neuropathy, depression, retinopathy, panic attacks, depression, (followed by psychiatry for PTSD, bipolar disease, and agoraphobia per patient report), on multiple psychotropic medications, and morbid obesity with a BMI of over 45, who reports a bilateral hand numbness for the past approximately 2 to 3 months.  She reports symptoms in her volar aspect of both hands with some discomfort in the pinky finger.  Symptoms do not extend beyond her wrist and do not involve the entire hand.  She has had intermittent hand trembling and jerking movements.  This has been going on for about 2 to 3 months as well.  She is followed by psychiatry, typically with a televisit.  She is on multiple psychotropic medications.  Recently stopped Vraylar, she stopped Cymbalta, she is on Seroquel 25 mg at bedtime for the past 3 weeks, she has been on Lamictal 150 mg daily, has been on it for years, she is supposed to start Jordan.  She stopped Abilify which was tried in the past.  She has never been on Haldol or Risperdal or Geodon.  She has been on Ativan before but currently on alprazolam 0.25 mg once or twice daily as needed.  She takes gabapentin 300 mg at bedtime.  She was started on propranolol by her psychiatrist, currently on 10 mg twice daily but was advised to increase it if needed.  She is no longer on prazosin.  She reports that this is currently on hold while  she is trying the propranolol.  She reports a family history of mood disorder/depression, mom had multiple medications for this and also developed a tremor in her hands in her later years.  Her balance has not she for very good, she has not fallen, she does not use a walking aid but holds onto things sometimes.  She feels that her medications have contributed to her balance issues, she feels lethargic, groggy, and dizzy sometimes. I reviewed your office note from 01/05/2022.  She did not get any blood tests at the time, we will request recent blood test results from your office.  Reportedly, her A1c in April 2023 was 7. She reported that her psychiatrist provider recommended neurological evaluation for numbness in her hands and trembling.    Her Past Medical History Is Significant For: Past Medical History:  Diagnosis Date   Agoraphobia    Anxiety    Bipolar 1 disorder (HCC)    Depression    Diabetes mellitus without complication (HCC)    Hypertension    Neuropathy    Panic attacks    PTSD (post-traumatic stress disorder)     Her Past Surgical History Is Significant For: Past Surgical History:  Procedure Laterality Date   ACHILLES TENDON SURGERY     CESAREAN SECTION      Her Family History Is Significant For: Family History  Problem Relation Age of Onset   Breast cancer Mother    Hypertension Father    Diabetes Father  Transient ischemic attack Father     Her Social History Is Significant For: Social History   Socioeconomic History   Marital status: Married    Spouse name: Not on file   Number of children: Not on file   Years of education: Not on file   Highest education level: Not on file  Occupational History   Not on file  Tobacco Use   Smoking status: Never   Smokeless tobacco: Never   Tobacco comments:    Per pt "I smoke Marijuana with my husband occassionally".   Vaping Use   Vaping Use: Never used  Substance and Sexual Activity   Alcohol use: No   Drug  use: No   Sexual activity: Not on file  Other Topics Concern   Not on file  Social History Narrative   Caffeine 1 cup coffee   Education : AD   Working : no   Social Determinants of Corporate investment banker Strain: Not on file  Food Insecurity: Not on file  Transportation Needs: Not on file  Physical Activity: Not on file  Stress: Not on file  Social Connections: Not on file    Her Allergies Are:  No Known Allergies:   Her Current Medications Are:  Outpatient Encounter Medications as of 01/13/2022  Medication Sig   Acetylcysteine (NAC) 600 MG CAPS Take 1 capsule by mouth daily.   ALPRAZolam (XANAX) 0.25 MG tablet Take 0.25 mg by mouth 2 (two) times daily as needed for anxiety.   ARIPiprazole (ABILIFY) 2 MG tablet Take 1 tablet (2 mg total) by mouth at bedtime. For mood   atorvastatin (LIPITOR) 40 MG tablet Take 40 mg by mouth daily.   Cholecalciferol (VITAMIN D) 50 MCG (2000 UT) tablet Take 2,000 Units by mouth daily.   DULoxetine (CYMBALTA) 60 MG capsule Take 1 capsule (60 mg total) by mouth daily. For mood   gabapentin (NEURONTIN) 100 MG capsule Take 300 mg by mouth at bedtime.   insulin glargine (LANTUS) 100 UNIT/ML injection Inject 0.65 mLs (65 Units total) into the skin 2 (two) times daily. For diabetes   JARDIANCE 25 MG TABS tablet Take 25 mg by mouth daily.   lamoTRIgine (LAMICTAL) 100 MG tablet Take 150 mg by mouth daily.   Levomefolate Glucosamine (METHYL-FOLATE) 1700 MCG CAPS Take 1 capsule by mouth daily.   lisinopril (PRINIVIL,ZESTRIL) 20 MG tablet Take 20 mg by mouth daily.   LITHIUM PO Take 450 mg by mouth daily.   LORazepam (ATIVAN) 0.5 MG tablet Take 1 tablet (0.5 mg total) by mouth every 6 (six) hours as needed for anxiety (panic attacks).   pramipexole (MIRAPEX) 1.5 MG tablet Take 1.5 mg by mouth daily.   prazosin (MINIPRESS) 1 MG capsule Take 1 mg by mouth at bedtime.   propranolol (INDERAL) 10 MG tablet Take 10 mg by mouth 2 (two) times daily.    Pyridoxine HCl (VITAMIN B-6 PO) Take 1 capsule by mouth daily.   QUEtiapine (SEROQUEL) 25 MG tablet Take 25 mg by mouth at bedtime.   traZODone (DESYREL) 50 MG tablet Take 1 tablet (50 mg total) by mouth at bedtime as needed for sleep.   [DISCONTINUED] cariprazine (VRAYLAR) 1.5 MG capsule Take 1.5 mg by mouth daily.   [DISCONTINUED] insulin lispro (HUMALOG KWIKPEN) 100 UNIT/ML KwikPen Inject into the skin.   No facility-administered encounter medications on file as of 01/13/2022.  :   Review of Systems:  Out of a complete 14 point review of systems, all are reviewed  and negative with the exception of these symptoms as listed below:  Review of Systems  Neurological:        Bilateral outer fingers/palm of hand numbness since April.  Hardtime writing. Shaking all over.      Objective:  Neurological Exam  Physical Exam Physical Examination:   Vitals:   01/13/22 0829  BP: (!) 151/71  Pulse: 68    General Examination: The patient is a very pleasant 61 y.o. female in no acute distress. She appears well-developed and well-nourished and well groomed.  She is mildly anxious appearing, near tears at times.  HEENT: Normocephalic, atraumatic, pupils are equal, round and reactive to light, extraocular tracking is good without limitation to gaze excursion or nystagmus noted. Hearing is grossly intact. Face is symmetric with slight facial masking noted.  No nuchal rigidity, no lip, neck or jaw tremor, normal facial sensation to temperature and vibration.  No hypophonia or dysarthria.  Mouth dryness noted.  No actual carotid bruits but radiating systolic murmur to left more than right neck area.  Chest: Clear to auscultation without wheezing, rhonchi or crackles noted.  Heart: S1+S2+0, regular with a 3 out of 6 pansystolic murmur noted.    Abdomen: Soft, non-tender and non-distended, but obese abdomen.  Extremities: There is trace pitting edema in the distal lower extremities bilaterally.    Skin: Warm and dry without trophic changes noted.   Musculoskeletal: exam reveals no obvious joint deformities, but she does have a prominent tendon in both palms, in the ring finger area, no obvious contracture but possibility of beginning Dupuytren's contractures bilaterally.  Neurologically:  Mental status: The patient is awake, alert and oriented in all 4 spheres. Her immediate and remote memory, attention, language skills and fund of knowledge are appropriate. There is no evidence of aphasia, agnosia, apraxia or anomia. Speech is clear with normal prosody and enunciation. Thought process is linear. Mood is constricted and affect is blunted.  Cranial nerves II - XII are as described above under HEENT exam.  Motor exam: Normal bulk, strength and tone is noted. There is no resting tremor, Romberg is not tested due to safety concerns.  Reflexes are 1+ in the upper extremities and trace in the knees and ankles.  Fine motor skills and coordination: grossly intact, no significant decrement in amplitude but slow movements noted.  She has no significant postural tremor in both upper extremities, no lower extremity tremor.  She has a slight action tremor in both upper extremities, no significant intention tremor, Archimedes spiral drawing shows mild hands, handwriting is mildly tremulous, not micrographic.   Cerebellar testing: No dysmetria or intention tremor. There is no truncal or gait ataxia.  Slow finger-to-nose. Sensory exam: intact to light touch, temperature and vibration sense in the upper extremities with slight decrease in dorsal aspect of both hands.  Mild decrease in temperature sense in the distal lower extremities bilaterally with preserved vibration sense noted.  Gait, station and balance: She stands with mild difficulty and stands up slowly.  She stands wide-based.  She walks slowly and cautiously, preserved arm swing noted, no shuffling.    Assessment:  In summary, ODESSIA ASLESON is a  very pleasant 61 y.o.-year old female with an underlying complex medical history of hypertension, hyperlipidemia, diabetes, history of neuropathy, depression, retinopathy, panic attacks, depression, (followed by psychiatry for PTSD, bipolar disease, and agoraphobia per patient report), on multiple psychotropic medications, and morbid obesity with a BMI of over 45, who presents for evaluation of her hand  numbness.  She does not have complete numbness, she does not have any obvious weakness or wasting in her hand muscles but has a possible beginning Dupuytren's contracture bilaterally.  She reports intermittent trembling.  She is advised that her polypharmacy may be contributing to her tremors.  She avoids caffeine on a day-to-day basis but drinks 1 cup of coffee per day.  She does not drink any alcohol or smoke but could drink a little bit more water she admits.  She is encouraged to stay well-hydrated.  She is on multiple psychotropic medications, lithium can cause hand tremors and so can her Seroquel.  She has been on Abilify before and on Vraylar and is supposed to start Jordan soon.  She is advised that polypharmacy can also contribute to her lethargy, grogginess and balance issues.  She is advised to be extremely cautious when she stands up, she is advised to stand up slowly and get her bearings first and consider using a cane.  She is advised to stay better hydrated with water, currently drinks about 2-3 bottles per day, is advised to drink about 3-4 bottles per day.  A1c has come down from 8-7 per her report.  She has been as high as 13 with her A1c in the past.  We talked about the importance of strict diabetes control.  She is advised to follow-up with you as scheduled, please make sure that she has a regular checkup with her A1c, also recommend that you check her thyroid function, vitamin D level, and B12 level if not done so already recently.  I did not see any recent blood test results but we will request  blood work from your office.  She is advised to follow-up with her psychiatrist.  Unfortunately, there is not a whole lot I can add for tremor control, she does not have any telltale signs and symptoms of idiopathic Parkinson's disease but could be at risk for parkinsonism, drug-induced.  She is already on propranolol and was advised by her psychiatrist to increase it, she is encouraged to do so cautiously as it can lower her heart rate and blood pressure.  She does not need to follow-up in this clinic on a regular basis but I did ask her to seek evaluation with a hand surgeon and I made a referral in that regard.  I answered all their questions today and the patient and her husband were in agreement with our plan. Thank you very much for allowing me to participate in the care of this nice patient. If I can be of any further assistance to you please do not hesitate to call me at [phone].  Sincerely,   Debbie Foley, MD, PhD

## 2022-01-13 NOTE — Patient Instructions (Signed)
You have a mild tremor of both hands, likely due to medication effect, including from lithium and/or Seroquel.  I would not recommend any additional medication for tremor control as you are already on multiple psychotropic medications and you are already on a beta-blocker called propranolol which we use for symptomatic tremor control.  You could increase this cautiously as instructed by your psychiatrist.  Please keep in mind that propranolol can lower your blood pressure and her heart rate.  For your hand numbness I will make a referral to a hand surgeon.  Please let us know after about 2 weeks if you do not hear from their office directly.   We do not need to make a follow-up appointment in this clinic.  Please consider using a cane and try to hydrate well with water, 3-4 bottles per day, 16.9 ounces each, are recommended generally speaking for the average adult.  Please stand up slowly and get your bearings first.  Please remember, that any kind of tremor may be exacerbated by anxiety, anger, nervousness, excitement, dehydration, sleep deprivation, thyroid dysfunction, by caffeine, and low blood sugar values or blood sugar fluctuations. Some medications can exacerbate tremors, this includes certain asthma or COPD medications and certain antidepressants.  Please make sure that you have had blood work to check your thyroid function and get your A1c checked on a regular basis.  If you have not had your vitamin B12 level checked and vitamin D level checked, I would recommend this as well.

## 2022-01-14 ENCOUNTER — Telehealth: Payer: Self-pay | Admitting: Neurology

## 2022-01-14 NOTE — Telephone Encounter (Signed)
Referral for Hand Surgery sent to [facility] [phone].

## 2022-03-31 DIAGNOSIS — F4312 Post-traumatic stress disorder, chronic: Secondary | ICD-10-CM | POA: Diagnosis not present

## 2022-04-01 DIAGNOSIS — G5621 Lesion of ulnar nerve, right upper limb: Secondary | ICD-10-CM | POA: Diagnosis not present

## 2022-04-01 DIAGNOSIS — G5601 Carpal tunnel syndrome, right upper limb: Secondary | ICD-10-CM | POA: Diagnosis not present

## 2022-04-01 DIAGNOSIS — G8918 Other acute postprocedural pain: Secondary | ICD-10-CM | POA: Diagnosis not present

## 2022-04-06 DIAGNOSIS — Z0000 Encounter for general adult medical examination without abnormal findings: Secondary | ICD-10-CM | POA: Diagnosis not present

## 2022-04-06 DIAGNOSIS — I10 Essential (primary) hypertension: Secondary | ICD-10-CM | POA: Diagnosis not present

## 2022-04-06 DIAGNOSIS — E11319 Type 2 diabetes mellitus with unspecified diabetic retinopathy without macular edema: Secondary | ICD-10-CM | POA: Diagnosis not present

## 2022-04-08 DIAGNOSIS — I129 Hypertensive chronic kidney disease with stage 1 through stage 4 chronic kidney disease, or unspecified chronic kidney disease: Secondary | ICD-10-CM | POA: Diagnosis not present

## 2022-04-08 DIAGNOSIS — Z1331 Encounter for screening for depression: Secondary | ICD-10-CM | POA: Diagnosis not present

## 2022-04-08 DIAGNOSIS — Z0000 Encounter for general adult medical examination without abnormal findings: Secondary | ICD-10-CM | POA: Diagnosis not present

## 2022-04-08 DIAGNOSIS — I10 Essential (primary) hypertension: Secondary | ICD-10-CM | POA: Diagnosis not present

## 2022-04-08 DIAGNOSIS — E1165 Type 2 diabetes mellitus with hyperglycemia: Secondary | ICD-10-CM | POA: Diagnosis not present

## 2022-04-08 DIAGNOSIS — R82998 Other abnormal findings in urine: Secondary | ICD-10-CM | POA: Diagnosis not present

## 2022-04-14 DIAGNOSIS — F4312 Post-traumatic stress disorder, chronic: Secondary | ICD-10-CM | POA: Diagnosis not present

## 2022-04-21 DIAGNOSIS — Z0271 Encounter for disability determination: Secondary | ICD-10-CM

## 2022-04-27 DIAGNOSIS — F4310 Post-traumatic stress disorder, unspecified: Secondary | ICD-10-CM | POA: Diagnosis not present

## 2022-04-27 DIAGNOSIS — F4001 Agoraphobia with panic disorder: Secondary | ICD-10-CM | POA: Diagnosis not present

## 2022-04-27 DIAGNOSIS — F3181 Bipolar II disorder: Secondary | ICD-10-CM | POA: Diagnosis not present

## 2022-04-28 DIAGNOSIS — F4312 Post-traumatic stress disorder, chronic: Secondary | ICD-10-CM | POA: Diagnosis not present

## 2022-05-11 DIAGNOSIS — F4001 Agoraphobia with panic disorder: Secondary | ICD-10-CM | POA: Diagnosis not present

## 2022-05-11 DIAGNOSIS — E559 Vitamin D deficiency, unspecified: Secondary | ICD-10-CM | POA: Diagnosis not present

## 2022-05-11 DIAGNOSIS — F4310 Post-traumatic stress disorder, unspecified: Secondary | ICD-10-CM | POA: Diagnosis not present

## 2022-05-11 DIAGNOSIS — F3181 Bipolar II disorder: Secondary | ICD-10-CM | POA: Diagnosis not present

## 2022-05-11 DIAGNOSIS — R32 Unspecified urinary incontinence: Secondary | ICD-10-CM | POA: Diagnosis not present

## 2022-05-11 DIAGNOSIS — Z23 Encounter for immunization: Secondary | ICD-10-CM | POA: Diagnosis not present

## 2022-05-12 DIAGNOSIS — F4312 Post-traumatic stress disorder, chronic: Secondary | ICD-10-CM | POA: Diagnosis not present

## 2022-05-20 DIAGNOSIS — G5622 Lesion of ulnar nerve, left upper limb: Secondary | ICD-10-CM | POA: Diagnosis not present

## 2022-06-09 DIAGNOSIS — F4312 Post-traumatic stress disorder, chronic: Secondary | ICD-10-CM | POA: Diagnosis not present

## 2022-06-10 DIAGNOSIS — F4001 Agoraphobia with panic disorder: Secondary | ICD-10-CM | POA: Diagnosis not present

## 2022-06-10 DIAGNOSIS — F3181 Bipolar II disorder: Secondary | ICD-10-CM | POA: Diagnosis not present

## 2022-06-10 DIAGNOSIS — F4310 Post-traumatic stress disorder, unspecified: Secondary | ICD-10-CM | POA: Diagnosis not present

## 2022-06-23 DIAGNOSIS — F4312 Post-traumatic stress disorder, chronic: Secondary | ICD-10-CM | POA: Diagnosis not present

## 2022-07-07 DIAGNOSIS — F4312 Post-traumatic stress disorder, chronic: Secondary | ICD-10-CM | POA: Diagnosis not present

## 2022-07-08 DIAGNOSIS — F4310 Post-traumatic stress disorder, unspecified: Secondary | ICD-10-CM | POA: Diagnosis not present

## 2022-07-08 DIAGNOSIS — F4001 Agoraphobia with panic disorder: Secondary | ICD-10-CM | POA: Diagnosis not present

## 2022-07-08 DIAGNOSIS — F3181 Bipolar II disorder: Secondary | ICD-10-CM | POA: Diagnosis not present

## 2022-07-24 DIAGNOSIS — F4312 Post-traumatic stress disorder, chronic: Secondary | ICD-10-CM | POA: Diagnosis not present

## 2022-08-04 DIAGNOSIS — F4001 Agoraphobia with panic disorder: Secondary | ICD-10-CM | POA: Diagnosis not present

## 2022-08-04 DIAGNOSIS — F4310 Post-traumatic stress disorder, unspecified: Secondary | ICD-10-CM | POA: Diagnosis not present

## 2022-08-04 DIAGNOSIS — F3181 Bipolar II disorder: Secondary | ICD-10-CM | POA: Diagnosis not present

## 2023-10-16 IMAGING — MG MM DIGITAL SCREENING BILAT W/ TOMO AND CAD
6 of 12 series · 6 of 36 positions shown · non-contrast
Comparison: Previous exam(s).

CLINICAL DATA: Screening.

EXAM:
DIGITAL SCREENING BILATERAL MAMMOGRAM WITH TOMOSYNTHESIS AND CAD
TECHNIQUE: Bilateral screening digital craniocaudal and mediolateral oblique
mammograms were obtained. Bilateral screening digital breast
tomosynthesis was performed. The images were evaluated with
computer-aided detection.

[R MLO synth-2D]
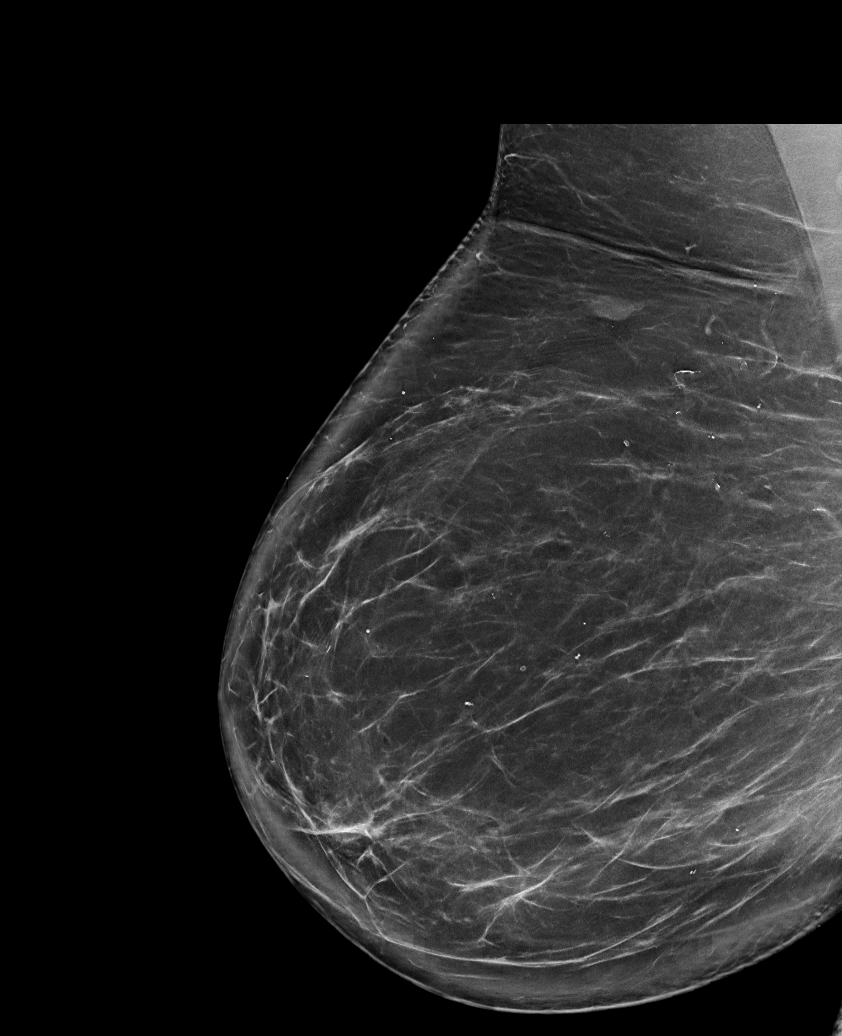

[L CC synth-2D (1 of 2)]
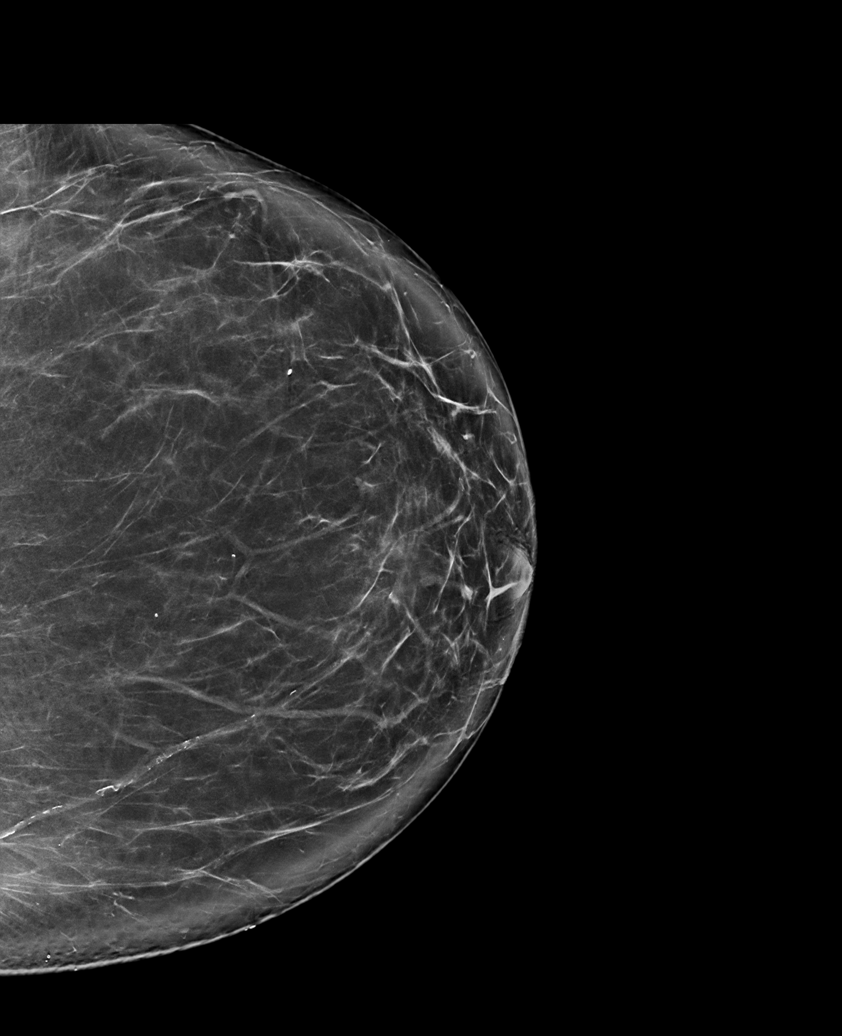

[R CC synth-2D (1 of 2)]
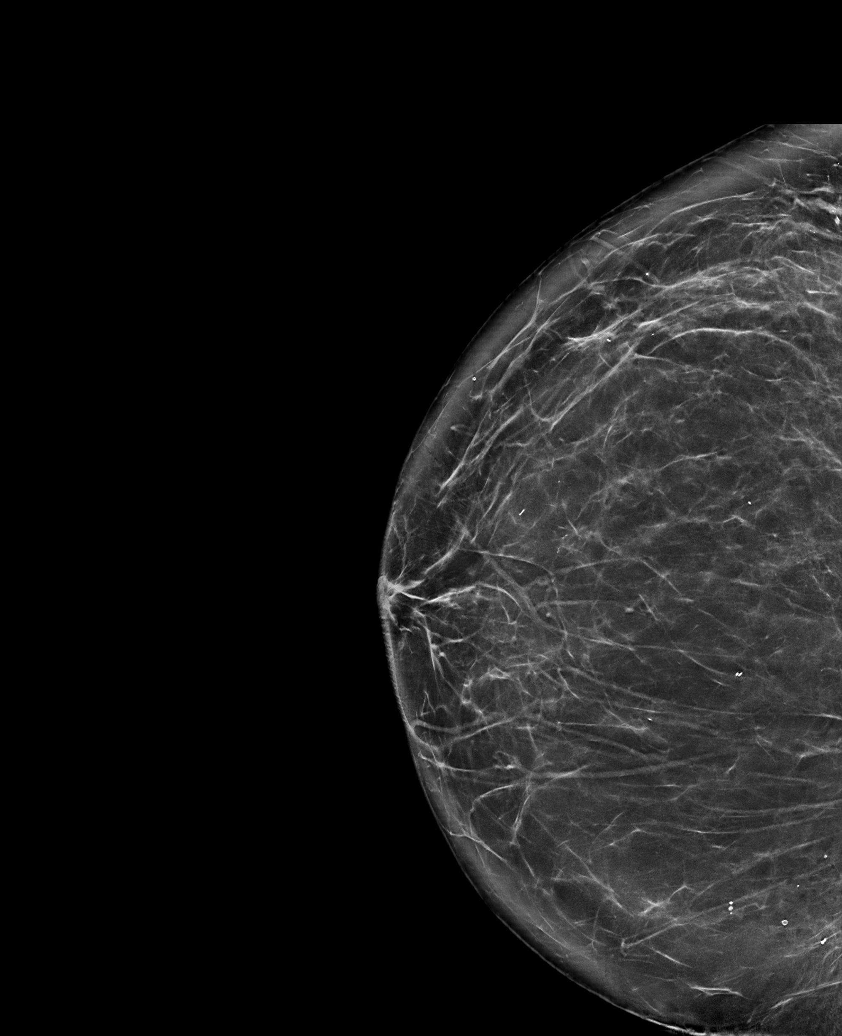

[R CC synth-2D (2 of 2)]
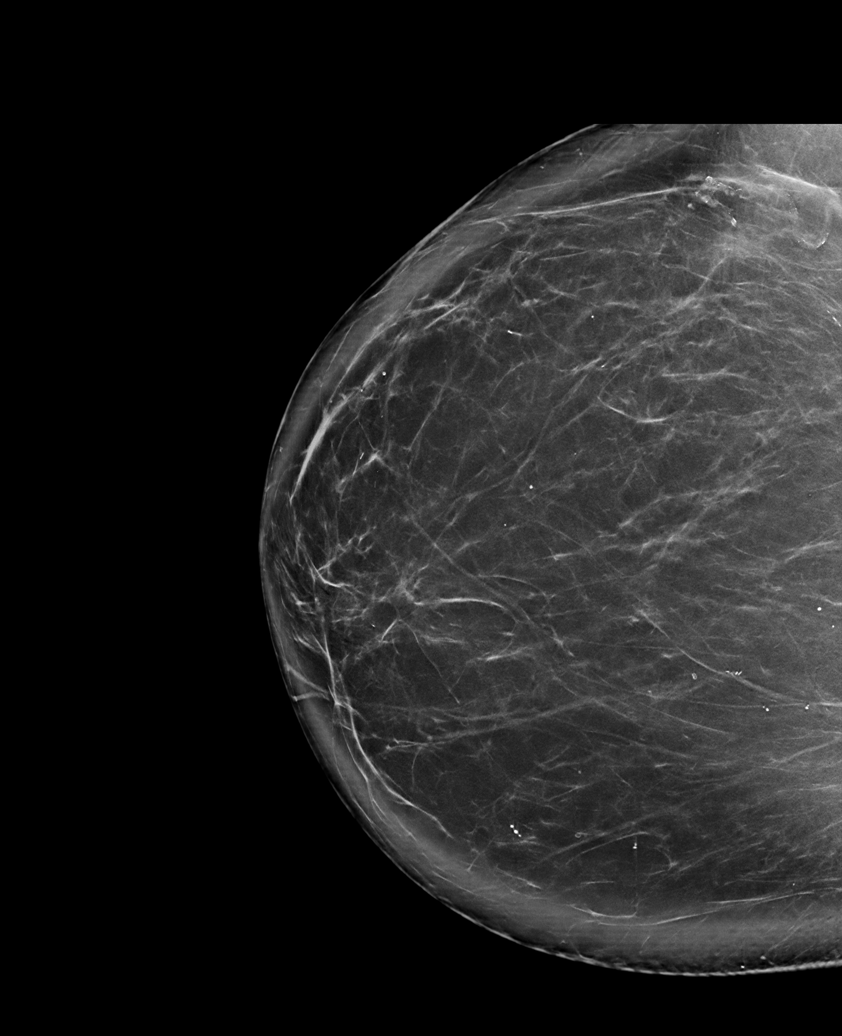

[L MLO synth-2D]
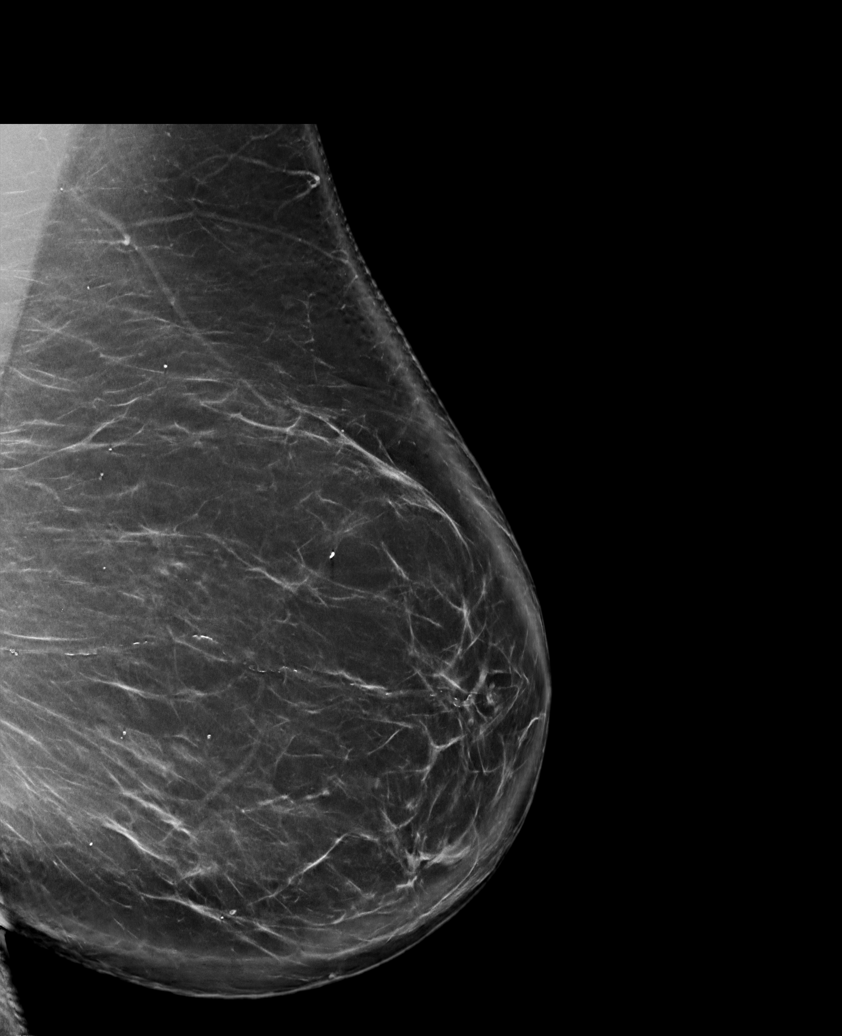

[L CC synth-2D (2 of 2)]
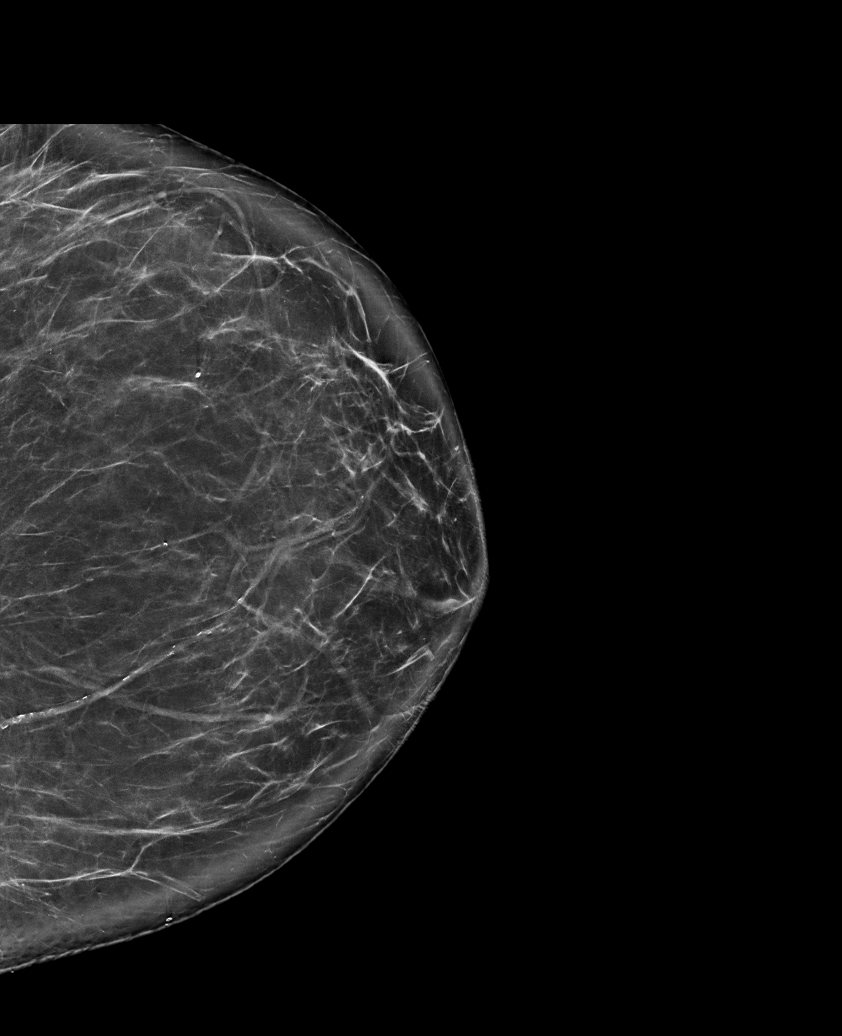

[6 of 36 positions shown; findings below may reference images not displayed]

ACR Breast Density Category b: There are scattered areas of
fibroglandular density.
FINDINGS: In the right breast, a possible mass warrants further evaluation. In
the left breast, no findings suspicious for malignancy.
IMPRESSION: Further evaluation is suggested for a possible mass in the right
breast.

RECOMMENDATION:
Diagnostic mammogram and possibly ultrasound of the right breast.
(Code:[id number]-E-WW6)

The patient will be contacted regarding the findings, and additional
imaging will be scheduled.

BI-RADS CATEGORY  0: Incomplete. Need additional imaging evaluation
and/or prior mammograms for comparison.
